# Patient Record
Sex: Male | Born: 1985
Health system: Southern US, Community
[De-identification: ages and names within clinical notes are randomized; demographics above are authoritative.]

## PROBLEM LIST (undated history)

## (undated) DIAGNOSIS — K219 Gastro-esophageal reflux disease without esophagitis: Secondary | ICD-10-CM

## (undated) HISTORY — DX: Gastro-esophageal reflux disease without esophagitis: K21.9

---

## 2017-09-22 ENCOUNTER — Encounter (HOSPITAL_COMMUNITY): Payer: Self-pay | Admitting: Emergency Medicine

## 2017-09-22 ENCOUNTER — Other Ambulatory Visit: Payer: Self-pay

## 2017-09-22 ENCOUNTER — Ambulatory Visit (HOSPITAL_COMMUNITY)
Admission: EM | Admit: 2017-09-22 | Discharge: 2017-09-22 | Disposition: A | Discharge status: Home / Self Care | Payer: BLUE CROSS/BLUE SHIELD | Attending: Internal Medicine | Admitting: Internal Medicine

## 2017-09-22 DIAGNOSIS — L0291 Cutaneous abscess, unspecified: Secondary | ICD-10-CM

## 2017-09-22 DIAGNOSIS — N611 Abscess of the breast and nipple: Secondary | ICD-10-CM | POA: Insufficient documentation

## 2017-09-22 MED ORDER — LIDOCAINE-EPINEPHRINE (PF) 2 %-1:200000 IJ SOLN
INTRAMUSCULAR | Status: AC
  Filled 2017-09-22: qty 20

## 2017-09-22 NOTE — ED Provider Notes (Signed)
[facility]    CSN: [id number] Arrival date & time: 09/22/17  1309     History   Chief Complaint Chief Complaint  Patient presents with  . Abscess    HPI Colin Butler is a 32 y.o. male.   [name] presents with complaints of abscess to right chest/ breast which has been present for approximately 2 weeks. Has not drained. Without fevers. Denies previously similar. Painful. Has not taken any medications or tried any treatments for his symptoms.    ROS per HPI.       History reviewed. No pertinent past medical history.  There are no active problems to display for this patient.   History reviewed. No pertinent surgical history.     Home Medications    Prior to Admission medications   Not on File    Family History No family history on file.  Social History Social History   Tobacco Use  . Smoking status: Not on file  Substance Use Topics  . Alcohol use: Not on file  . Drug use: Not on file     Allergies   Patient has no known allergies.   Review of Systems Review of Systems   Physical Exam Triage Vital Signs ED Triage Vitals  Enc Vitals Group     BP 09/22/17 1342 138/89     Pulse Rate 09/22/17 1342 79     Resp 09/22/17 1342 14     Temp 09/22/17 1342 97.9 F (36.6 C)     Temp src --      SpO2 09/22/17 1342 100 %     Weight --      Height --      Head Circumference --      Peak Flow --      Pain Score 09/22/17 1344 10     Pain Loc --      Pain Edu? --      Excl. in GC? --    No data found.  Updated Vital Signs BP 138/89   Pulse 79   Temp 97.9 F (36.6 C)   Resp 14   SpO2 100%   Visual Acuity Right Eye Distance:   Left Eye Distance:   Bilateral Distance:    Right Eye Near:   Left Eye Near:    Bilateral Near:     Physical Exam  Constitutional: He is oriented to person, place, and time. He appears well-developed and well-nourished.  Cardiovascular: Normal rate and regular rhythm.  Pulmonary/Chest: Effort normal  and breath sounds normal.  See photo; abscess presence; mild redness surrounding head; without fluctuance to surrounding tissue    Neurological: He is alert and oriented to person, place, and time.  Skin: Skin is warm and dry.       UC Treatments / Results  Labs (all labs ordered are listed, but only abnormal results are displayed) Labs Reviewed  AEROBIC CULTURE (SUPERFICIAL SPECIMEN)    EKG  EKG Interpretation None       Radiology No results found.  Procedures Incision and Drainage Date/Time: 09/22/2017 2:46 PM Performed by: Georgetta Haber, NP Authorized by: Arnaldo Natal, MD   Consent:    Consent obtained:  Verbal   Consent given by:  Patient and spouse   Risks discussed:  Bleeding, incomplete drainage, pain and infection   Alternatives discussed:  No treatment, alternative treatment, observation and referral Location:    Type:  Abscess   Size:  1cm    Location:  Trunk  Trunk location:  R breast Pre-procedure details:    Skin preparation:  Betadine Anesthesia (see MAR for exact dosages):    Anesthesia method:  Local infiltration   Local anesthetic:  Lidocaine 2% WITH epi Procedure type:    Complexity:  Simple Procedure details:    Needle aspiration: no     Incision types:  Single straight   Scalpel blade:  11   Wound management:  Probed and deloculated   Drainage:  Purulent (thick white)   Drainage amount:  Moderate   Wound treatment:  Wound left open   Packing materials:  None Post-procedure details:    Patient tolerance of procedure:  Tolerated well, no immediate complications   (including critical care time)  Medications Ordered in UC Medications - No data to display   Initial Impression / Assessment and Plan / UC Course  I have reviewed the triage vital signs and the nursing notes.  Pertinent labs & imaging results that were available during my care of the patient were reviewed by me and considered in my medical decision making  (see chart for details).     I&d completed, tolerated. Discharge appears cystic in nature, and with breast location encouraged establish and follow with PCP as may need further evaluation and follow up for this. Patient and wife verbalized understanding and agreeable to plan.  Culture sent. Antibiotics deferred at this time.   Final Clinical Impressions(s) / UC Diagnoses   Final diagnoses:  Abscess    ED Discharge Orders    None       Controlled Substance Prescriptions [place] Controlled Substance Registry consulted? Not Applicable   Georgetta HaberBurky, Ross Hefferan B, NP 09/22/17 1448

## 2017-09-22 NOTE — Discharge Instructions (Signed)
Tylenol and/or ibuprofen for pain control.  Apply heat to further promote drainage.  Keep wound covered. Wash daily with soap and water. Appears that this is likely cystic, may need further intervention. Please establish with a primary care provider for recheck of wound and further management/ referral as needed.

## 2017-09-22 NOTE — ED Triage Notes (Signed)
Pt c/o bump on chest 1x week. Area has white head with surrounding redness. Unknown cause, denies insect bite.

## 2017-09-24 LAB — AEROBIC CULTURE W GRAM STAIN (SUPERFICIAL SPECIMEN): Gram Stain: NONE SEEN

## 2017-09-24 LAB — AEROBIC CULTURE  (SUPERFICIAL SPECIMEN)

## 2017-09-26 ENCOUNTER — Other Ambulatory Visit: Payer: Self-pay

## 2017-09-26 ENCOUNTER — Telehealth (HOSPITAL_COMMUNITY): Payer: Self-pay | Admitting: Emergency Medicine

## 2017-09-26 ENCOUNTER — Ambulatory Visit (HOSPITAL_COMMUNITY)
Admission: EM | Admit: 2017-09-26 | Discharge: 2017-09-26 | Disposition: A | Discharge status: Home / Self Care | Payer: BLUE CROSS/BLUE SHIELD | Attending: Internal Medicine | Admitting: Internal Medicine

## 2017-09-26 ENCOUNTER — Encounter (HOSPITAL_COMMUNITY): Payer: Self-pay | Admitting: Emergency Medicine

## 2017-09-26 DIAGNOSIS — Z09 Encounter for follow-up examination after completed treatment for conditions other than malignant neoplasm: Secondary | ICD-10-CM

## 2017-09-26 DIAGNOSIS — L0291 Cutaneous abscess, unspecified: Secondary | ICD-10-CM

## 2017-09-26 MED ORDER — NAPROXEN 500 MG PO TABS: 500.0000 mg | ORAL_TABLET | Freq: Two times a day (BID) | ORAL | 0 refills | Status: DC

## 2017-09-26 MED ORDER — SULFAMETHOXAZOLE-TRIMETHOPRIM 800-160 MG PO TABS: 1.0000 | ORAL_TABLET | Freq: Two times a day (BID) | ORAL | 0 refills | Status: AC

## 2017-09-26 NOTE — ED Triage Notes (Signed)
Pt here to have wound recheck, seen here for abscess on the 14th and states it still hurting.

## 2017-09-26 NOTE — Telephone Encounter (Signed)
Called pt to notify of recent lab results Pt did not speak English so had be speak to Uncle.... Pt ID'd properly Reports persistent pain and fever Requested Bactrim to be sent to [facility] ([place]) Adv pt if sx are not getting better to return or to f/u w/PCP Notified pt that lab results can be obtained through MyChart Pt verb understanding.

## 2017-09-26 NOTE — Telephone Encounter (Signed)
-----   Message from Isa RankinLaura Wilson Murray, MD sent at 09/25/2017  7:29 AM EST ----- Clinical staff, please let patient know that abscess culture was positve for rare (few) Staph germs.  Lesion was incised and drainaged which is often curative. No additional treatment is needed if abscess is healing, with decreasing redness/swelling/drainage/pain at abscess site and no fever >1005. If increasing redness/swelling/drainage/pain at abscess site or new fever >100.5, ok to send rx for trimethoprim/sulfa DS 1 tab bid x 7d #14 no refills, and patient should recheck for further evaluation.  LM

## 2017-09-26 NOTE — Telephone Encounter (Signed)
Pt came in needing a note for work.... sts he has not been to work since 09/22/17 b/c of pain.   Per Dr. Dayton ScrapeMurray, pt is to check in for further eval.   Pt verb understanding.

## 2017-09-26 NOTE — Discharge Instructions (Addendum)
Wound is healing.  Anticipate gradual improvement in pain/swelling/redness/drainage over the next several days.  Pain may take a while to resolve.  Sometimes cysts recur and have to be incised/drained again.  Prescriptions for trimethoprim/sulfa (antibiotic) and naproxen (for pain) sent to the pharmacy. Note for work extended, return to work 1/19.

## 2017-09-27 NOTE — ED Provider Notes (Signed)
[facility]    CSN: [id number] Arrival date & time: 09/26/17  1317     History   Chief Complaint Chief Complaint  Patient presents with  . Follow-up  . Wound Check    HPI Colin Butler is a 32 y.o. male. He presents with persistent pain as high as 8/10 in abscess site.  Redness/swelling/drainage improving, no fever.  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  HPI  History reviewed. No pertinent past medical history.  History reviewed. No pertinent surgical history.     Home Medications    Prior to Admission medications   Medication Sig Start Date End Date Taking? Authorizing Provider  naproxen (NAPROSYN) 500 MG tablet Take 1 tablet (500 mg total) by mouth 2 (two) times daily. For pain 09/26/17   Isa Rankin, MD  sulfamethoxazole-trimethoprim (BACTRIM DS,SEPTRA DS) 800-160 MG tablet Take 1 tablet by mouth 2 (two) times daily for 7 days. 09/26/17 10/03/17  Isa Rankin, MD    Family History No family history on file.  Social History Social History   Tobacco Use  . Smoking status: Never Smoker  Substance Use Topics  . Alcohol use: No    Frequency: Never  . Drug use: Not on file  employed as a laborer   Allergies   Patient has no known allergies.   Review of Systems Review of Systems  All other systems reviewed and are negative.    Physical Exam Triage Vital Signs ED Triage Vitals  Enc Vitals Group     BP 09/26/17 1411 123/89     Pulse Rate 09/26/17 1411 77     Resp 09/26/17 1411 13     Temp 09/26/17 1411 98.8 F (37.1 C)     Temp src --      SpO2 09/26/17 1411 100 %     Weight --      Height --      Pain Score 09/26/17 1412 8     Pain Loc --    Updated Vital Signs BP 123/89   Pulse 77   Temp 98.8 F (37.1 C)   Resp 13   SpO2 100%  Physical Exam  Constitutional: He is oriented to person, place, and time. No distress.  Alert, nicely groomed  HENT:  Head: Atraumatic.  Eyes:  Conjugate gaze, no  eye redness/drainage  Neck: Neck supple.  Cardiovascular: Normal rate.  Pulmonary/Chest: No respiratory distress.  Abdominal: He exhibits no distension.  Musculoskeletal: Normal range of motion.  Neurological: He is alert and oriented to person, place, and time.  Skin: Skin is warm and dry.  No cyanosis Site healing, without erythema/warmth/drainage.  Scab over incision site. Still some persistence of cystic feel to wound site, mildly tender but site is not inflamed appearing, not fluctuant.  Nursing note and vitals reviewed.    UC Treatments / Results  Labs Results for orders placed or performed during the hospital encounter of 09/22/17  Wound or Superficial Culture  Result Value Ref Range   Specimen Description WOUND    Special Requests BREAST RIGHT    Gram Stain      NO WBC SEEN RARE GRAM POSITIVE COCCI RARE GRAM POSITIVE RODS    Culture RARE STAPHYLOCOCCUS SPECIES (COAGULASE NEGATIVE)    Report Status 09/24/2017 FINAL     Procedures Procedures (including critical care time) None today  Final Clinical Impressions(s) / UC Diagnoses   Final diagnoses:  Encounter for recheck of abscess following incision and drainage  Wound is healing.  Anticipate gradual improvement in pain/swelling/redness/drainage over the next several days to couple weeks.  Pain may take a while to resolve.  Sometimes cysts recur and have to be incised/drained again, or removed surgically.  Prescriptions for trimethoprim/sulfa (antibiotic) was sent to the pharmacy earlier today, based on I&D culture results, and naproxen (for pain) was sent to the pharmacy just now. Note for work extended, return to work 1/19.     ED Discharge Orders        Ordered    naproxen (NAPROSYN) 500 MG tablet  2 times daily     09/26/17 1456        Isa RankinMurray, Montell Leopard Wilson, MD 09/27/17 1137

## 2017-10-21 ENCOUNTER — Encounter: Payer: Self-pay | Admitting: Family Medicine

## 2017-10-21 ENCOUNTER — Ambulatory Visit (INDEPENDENT_AMBULATORY_CARE_PROVIDER_SITE_OTHER): Payer: BLUE CROSS/BLUE SHIELD | Admitting: Family Medicine

## 2017-10-21 ENCOUNTER — Other Ambulatory Visit: Payer: Self-pay

## 2017-10-21 VITALS — BP 124/70 | HR 69 | Temp 98.8°F | Ht 65.0 in | Wt 153.0 lb

## 2017-10-21 DIAGNOSIS — K219 Gastro-esophageal reflux disease without esophagitis: Secondary | ICD-10-CM

## 2017-10-21 DIAGNOSIS — Z13220 Encounter for screening for lipoid disorders: Secondary | ICD-10-CM | POA: Diagnosis not present

## 2017-10-21 DIAGNOSIS — Z130 Encounter for screening for diseases of the blood and blood-forming organs and certain disorders involving the immune mechanism: Secondary | ICD-10-CM

## 2017-10-21 DIAGNOSIS — Z23 Encounter for immunization: Secondary | ICD-10-CM | POA: Diagnosis not present

## 2017-10-21 DIAGNOSIS — Z114 Encounter for screening for human immunodeficiency virus [HIV]: Secondary | ICD-10-CM

## 2017-10-21 DIAGNOSIS — M25511 Pain in right shoulder: Secondary | ICD-10-CM

## 2017-10-21 DIAGNOSIS — Z136 Encounter for screening for cardiovascular disorders: Secondary | ICD-10-CM

## 2017-10-21 MED ORDER — IBUPROFEN 600 MG PO TABS: 600.0000 mg | ORAL_TABLET | Freq: Four times a day (QID) | ORAL | 0 refills | Status: DC | PRN

## 2017-10-21 MED ORDER — RANITIDINE HCL 150 MG PO TABS: 150.0000 mg | ORAL_TABLET | Freq: Two times a day (BID) | ORAL | 1 refills | Status: DC

## 2017-10-21 NOTE — Patient Instructions (Signed)
It was great seeing you today! We have addressed the following issues today  1. I will do blood work for you today (lipid panel , CBC, CMP ) and will follow up on the results. 2. I prescribe a medication for your reflux 3. I want you to use ibuprofen, and ice for your shoulder and neck pain. If it does not get better will do further testing.  If we did any lab work today, and the results require attention, either me or my nurse will get in touch with you. If everything is normal, you will get a letter in mail and a message via . If you don't hear from us in two weeks, please give us a call. Otherwise, we look forward to seeing you again at your next visit. If you have any questions or concerns before then, please call the clinic at [phone].  Please bring all your medications to every doctors visit  Sign up for My Chart to have easy access to your labs results, and communication with your Primary care physician. Please ask Front Desk for some assistance.   Please check-out at the front desk before leaving the clinic.    Take Care,   Dr. Sydnee Cabaliallo  Shoulder Exercises Ask your health care provider which exercises are safe for you. Do exercises exactly as told by your health care provider and adjust them as directed. It is normal to feel mild stretching, pulling, tightness, or discomfort as you do these exercises, but you should stop right away if you feel sudden pain or your pain gets worse.Do not begin these exercises until told by your health care provider. RANGE OF MOTION EXERCISES These exercises warm up your muscles and joints and improve the movement and flexibility of your shoulder. These exercises also help to relieve pain, numbness, and tingling. These exercises involve stretching your injured shoulder directly. Exercise A: Pendulum  1. Stand near a wall or a surface that you can hold onto for balance. 2. Bend at the waist and let your left / right arm hang straight down. Use  your other arm to support you. Keep your back straight and do not lock your knees. 3. Relax your left / right arm and shoulder muscles, and move your hips and your trunk so your left / right arm swings freely. Your arm should swing because of the motion of your body, not because you are using your arm or shoulder muscles. 4. Keep moving your body so your arm swings in the following directions, as told by your health care provider: ? Side to side. ? Forward and backward. ? In clockwise and counterclockwise circles. 5. Continue each motion for __________ seconds, or for as long as told by your health care provider. 6. Slowly return to the starting position. Repeat __________ times. Complete this exercise __________ times a day. Exercise B:Flexion, Standing  1. Stand and hold a broomstick, a cane, or a similar object. Place your hands a little more than shoulder-width apart on the object. Your left / right hand should be palm-up, and your other hand should be palm-down. 2. Keep your elbow straight and keep your shoulder muscles relaxed. Push the stick down with your healthy arm to raise your left / right arm in front of your body, and then over your head until you feel a stretch in your shoulder. ? Avoid shrugging your shoulder while you raise your arm. Keep your shoulder blade tucked down toward the middle of your back. 3. Hold for  __________ seconds. 4. Slowly return to the starting position. Repeat __________ times. Complete this exercise __________ times a day. Exercise C: Abduction, Standing 1. Stand and hold a broomstick, a cane, or a similar object. Place your hands a little more than shoulder-width apart on the object. Your left / right hand should be palm-up, and your other hand should be palm-down. 2. While keeping your elbow straight and your shoulder muscles relaxed, push the stick across your body toward your left / right side. Raise your left / right arm to the side of your body and  then over your head until you feel a stretch in your shoulder. ? Do not raise your arm above shoulder height, unless your health care provider tells you to do that. ? Avoid shrugging your shoulder while you raise your arm. Keep your shoulder blade tucked down toward the middle of your back. 3. Hold for __________ seconds. 4. Slowly return to the starting position. Repeat __________ times. Complete this exercise __________ times a day. Exercise D:Internal Rotation  1. Place your left / right hand behind your back, palm-up. 2. Use your other hand to dangle an exercise band, a towel, or a similar object over your shoulder. Grasp the band with your left / right hand so you are holding onto both ends. 3. Gently pull up on the band until you feel a stretch in the front of your left / right shoulder. ? Avoid shrugging your shoulder while you raise your arm. Keep your shoulder blade tucked down toward the middle of your back. 4. Hold for __________ seconds. 5. Release the stretch by letting go of the band and lowering your hands. Repeat __________ times. Complete this exercise __________ times a day. STRETCHING EXERCISES These exercises warm up your muscles and joints and improve the movement and flexibility of your shoulder. These exercises also help to relieve pain, numbness, and tingling. These exercises are done using your healthy shoulder to help stretch the muscles of your injured shoulder. Exercise E: Research officer, political party (External Rotation and Abduction)  1. Stand in a doorway with one of your feet slightly in front of the other. This is called a staggered stance. If you cannot reach your forearms to the door frame, stand facing a corner of a room. 2. Choose one of the following positions as told by your health care provider: ? Place your hands and forearms on the door frame above your head. ? Place your hands and forearms on the door frame at the height of your head. ? Place your hands on the door  frame at the height of your elbows. 3. Slowly move your weight onto your front foot until you feel a stretch across your chest and in the front of your shoulders. Keep your head and chest upright and keep your abdominal muscles tight. 4. Hold for __________ seconds. 5. To release the stretch, shift your weight to your back foot. Repeat __________ times. Complete this stretch __________ times a day. Exercise F:Extension, Standing 1. Stand and hold a broomstick, a cane, or a similar object behind your back. ? Your hands should be a little wider than shoulder-width apart. ? Your palms should face away from your back. 2. Keeping your elbows straight and keeping your shoulder muscles relaxed, move the stick away from your body until you feel a stretch in your shoulder. ? Avoid shrugging your shoulders while you move the stick. Keep your shoulder blade tucked down toward the middle of your back. 3. Hold for  __________ seconds. 4. Slowly return to the starting position. Repeat __________ times. Complete this exercise __________ times a day. STRENGTHENING EXERCISES These exercises build strength and endurance in your shoulder. Endurance is the ability to use your muscles for a long time, even after they get tired. Exercise G:External Rotation  1. Sit in a stable chair without armrests. 2. Secure an exercise band at elbow height on your left / right side. 3. Place a soft object, such as a folded towel or a small pillow, between your left / right upper arm and your body to move your elbow a few inches away (about 10 cm) from your side. 4. Hold the end of the band so it is tight and there is no slack. 5. Keeping your elbow pressed against the soft object, move your left / right forearm out, away from your abdomen. Keep your body steady so only your forearm moves. 6. Hold for __________ seconds. 7. Slowly return to the starting position. Repeat __________ times. Complete this exercise __________ times  a day. Exercise H:Shoulder Abduction  1. Sit in a stable chair without armrests, or stand. 2. Hold a __________ weight in your left / right hand, or hold an exercise band with both hands. 3. Start with your arms straight down and your left / right palm facing in, toward your body. 4. Slowly lift your left / right hand out to your side. Do not lift your hand above shoulder height unless your health care provider tells you that this is safe. ? Keep your arms straight. ? Avoid shrugging your shoulder while you do this movement. Keep your shoulder blade tucked down toward the middle of your back. 5. Hold for __________ seconds. 6. Slowly lower your arm, and return to the starting position. Repeat __________ times. Complete this exercise __________ times a day. Exercise I:Shoulder Extension 1. Sit in a stable chair without armrests, or stand. 2. Secure an exercise band to a stable object in front of you where it is at shoulder height. 3. Hold one end of the exercise band in each hand. Your palms should face each other. 4. Straighten your elbows and lift your hands up to shoulder height. 5. Step back, away from the secured end of the exercise band, until the band is tight and there is no slack. 6. Squeeze your shoulder blades together as you pull your hands down to the sides of your thighs. Stop when your hands are straight down by your sides. Do not let your hands go behind your body. 7. Hold for __________ seconds. 8. Slowly return to the starting position. Repeat __________ times. Complete this exercise __________ times a day. Exercise J:Standing Shoulder Row 1. Sit in a stable chair without armrests, or stand. 2. Secure an exercise band to a stable object in front of you so it is at waist height. 3. Hold one end of the exercise band in each hand. Your palms should be in a thumbs-up position. 4. Bend each of your elbows to an "L" shape (about 90 degrees) and keep your upper arms at your  sides. 5. Step back until the band is tight and there is no slack. 6. Slowly pull your elbows back behind you. 7. Hold for __________ seconds. 8. Slowly return to the starting position. Repeat __________ times. Complete this exercise __________ times a day. Exercise K:Shoulder Press-Ups  1. Sit in a stable chair that has armrests. Sit upright, with your feet flat on the floor. 2. Put your hands on  the armrests so your elbows are bent and your fingers are pointing forward. Your hands should be about even with the sides of your body. 3. Push down on the armrests and use your arms to lift yourself off of the chair. Straighten your elbows and lift yourself up as much as you comfortably can. ? Move your shoulder blades down, and avoid letting your shoulders move up toward your ears. ? Keep your feet on the ground. As you get stronger, your feet should support less of your body weight as you lift yourself up. 4. Hold for __________ seconds. 5. Slowly lower yourself back into the chair. Repeat __________ times. Complete this exercise __________ times a day. Exercise L: Wall Push-Ups  1. Stand so you are facing a stable wall. Your feet should be about one arm-length away from the wall. 2. Lean forward and place your palms on the wall at shoulder height. 3. Keep your feet flat on the floor as you bend your elbows and lean forward toward the wall. 4. Hold for __________ seconds. 5. Straighten your elbows to push yourself back to the starting position. Repeat __________ times. Complete this exercise __________ times a day. This information is not intended to replace advice given to you by your health care provider. Make sure you discuss any questions you have with your health care provider. Document Released: 07/10/2005 Document Revised: 05/20/2016 Document Reviewed: 05/07/2015 Elsevier Interactive Patient Education  2018 [employer].

## 2017-10-22 LAB — CMP14+EGFR
A/G RATIO: 1.7 (ref 1.2–2.2)
ALK PHOS: 84 IU/L (ref 39–117)
ALT: 38 IU/L (ref 0–44)
AST: 26 IU/L (ref 0–40)
Albumin: 5.2 g/dL (ref 3.5–5.5)
BUN/Creatinine Ratio: 7 — ABNORMAL LOW (ref 9–20)
BUN: 6 mg/dL (ref 6–20)
Bilirubin Total: 0.5 mg/dL (ref 0.0–1.2)
CALCIUM: 9.8 mg/dL (ref 8.7–10.2)
CO2: 24 mmol/L (ref 20–29)
Chloride: 104 mmol/L (ref 96–106)
Creatinine, Ser: 0.88 mg/dL (ref 0.76–1.27)
GFR calc Af Amer: 134 mL/min/{1.73_m2} (ref >59)
GFR, EST NON AFRICAN AMERICAN: 116 mL/min/{1.73_m2} (ref >59)
Globulin, Total: 3 g/dL (ref 1.5–4.5)
Glucose: 82 mg/dL (ref 65–99)
POTASSIUM: 4.2 mmol/L (ref 3.5–5.2)
Sodium: 144 mmol/L (ref 134–144)
Total Protein: 8.2 g/dL (ref 6.0–8.5)

## 2017-10-22 LAB — CBC WITH DIFFERENTIAL/PLATELET
Basophils Absolute: 0 10*3/uL (ref 0.0–0.2)
Basos: 0 %
EOS (ABSOLUTE): 0.1 10*3/uL (ref 0.0–0.4)
EOS: 1 %
HEMATOCRIT: 42.1 % (ref 37.5–51.0)
HEMOGLOBIN: 14.8 g/dL (ref 13.0–17.7)
Immature Grans (Abs): 0 10*3/uL (ref 0.0–0.1)
Immature Granulocytes: 0 %
Lymphocytes Absolute: 3 10*3/uL (ref 0.7–3.1)
Lymphs: 31 %
MCH: 28.9 pg (ref 26.6–33.0)
MCHC: 35.2 g/dL (ref 31.5–35.7)
MCV: 82 fL (ref 79–97)
MONOCYTES: 6 %
Monocytes Absolute: 0.6 10*3/uL (ref 0.1–0.9)
NEUTROS ABS: 6.2 10*3/uL (ref 1.4–7.0)
Neutrophils: 62 %
Platelets: 279 10*3/uL (ref 150–379)
RBC: 5.12 x10E6/uL (ref 4.14–5.80)
RDW: 12.7 % (ref 12.3–15.4)
WBC: 9.9 10*3/uL (ref 3.4–10.8)

## 2017-10-22 LAB — LIPID PANEL
CHOL/HDL RATIO: 4.1 ratio (ref 0.0–5.0)
Cholesterol, Total: 151 mg/dL (ref 100–199)
HDL: 37 mg/dL — ABNORMAL LOW (ref >39)
LDL CALC: 80 mg/dL (ref 0–99)
TRIGLYCERIDES: 168 mg/dL — AB (ref 0–149)
VLDL CHOLESTEROL CAL: 34 mg/dL (ref 5–40)

## 2017-10-22 LAB — HIV ANTIBODY (ROUTINE TESTING W REFLEX): HIV SCREEN 4TH GENERATION: NONREACTIVE

## 2017-10-22 NOTE — Progress Notes (Signed)
Subjective:   Chief Complaint  Patient presents with  . Establish Care   HPI Read is a 32 y.o. old male here  for annual exam.  Concern today: Reflux and upper back pain Changes in his/her health in the last 12 months: no Occupation: Oncologist, stitching/Tailor Wears seatbelt: yes.    The patient has regular exercise: no.   Enough vegetables and fruits: yes.  Smokes cigarette: yes Drinks EtOH: Occasionally Drug use: no Patient takes ASA: no.  Patient takes vitD & Ca: no. Ever been transfused or tattooed?: no.  The patient is sexually active.  Patient uses birth control: no.  Domestic violence: no.  Advance directive: no. MOST: no.   History of depression:no.  Patient dental home: no.   Immunizations  Needs influenza vaccine: yes.  Needs HPV (Women until age 44): n/a  Needs Shingrix (all >35yr of age): no.  Needs Tdap: no.  Needs Pneumococcal: no. 1. 166to 30years of age  -Intermediate risk groups (smokers; chronic heart, lung and liver  disease, DM & alcoholism) PPSV23 alone:  (Grade 1B).   -High risk groups (asplenia, immunocompromised [HIV, CA], CSF leak, cochlear implant, advanced kidney dis)-PCV13, then PPSV23 after 8 wks. (Grade 1B). PCV13 after 89yrf already had PPSV23.  2.   Age ? 65: PCV13 followed by PPSV23 6 to 12 months later. PCV13 after 1y61yr already had PPSV23.  Screening Need colon cancer screening: no. Need breast cancer screening: Not applicable. Need cervical cancer Screening: not applicable. STOP BANG >/=3 for OSA: no. Need lung cancer screening (men > 55):no. Need AAA screening (men 65-74, >100 cigarettes):no At risk for skin cancer: no. Need HCV Screening: no. Need STI Screening: no. Fall in the last 12 months:no  PMH/Problem List: does not have a problem list on file.   has no past medical history on file.  [facility]. No pertinent family history. Family history of heart disease before age of 60 57s:  no. Family history of stroke: no. Family history of cancer: no.  SH Social History   Tobacco Use  . Smoking status: Never Smoker  . Smokeless tobacco: Never Used  Substance Use Topics  . Alcohol use: No    Frequency: Never  . Drug use: Not on file     Review of Systems  Constitutional: Negative.   HENT: Negative.   Eyes: Negative.         Objective:   Physical Exam Vitals:   10/21/17 1538  BP: 124/70  Pulse: 69  Temp: 98.8 F (37.1 C)  TempSrc: Oral  SpO2: 99%  Weight: 153 lb (69.4 kg)  Height: 5' 5"  (1.651 m)   Body mass index is 25.46 kg/m.  GEN: appears well, no apparent distress. Head: normocephalic and atraumatic  Eyes: conjunctiva without injection, sclera anicteric Ears: external ear and ear canal normal Nares: no rhinorrhea, congestion or erythema Oropharynx: mmm without erythema or exudation, poor dentition with multiples carries.  HEM: negative for cervical or periauricular lymphadenopathies CVS: RRR, nl s1 & s2, no murmurs, no edema,  2+ DP & PT bil RESP: no IWOB, good air movement bilaterally, CTAB GI: BS present & normal, soft, NTND, no guarding, no rebound, no mass GU: no suprapubic or CVA tenderness MSK: upper back tenderness radiating to neck on the right side. Right shoulder exam is normal. Left upper back and shoulder exam is within normal limit. SKIN: no apparent skin lesion ENDO: negative thyromegaly  NEURO: alert and oiented appropriately, no  gross deficits  PSYCH: euthymic mood with congruent affect     Assessment & Plan:  1. Need for immunization against influenza  - Received Flu Vaccine QUAD 36+ mos IM  2. Gastroesophageal reflux disease, esophagitis presence not specified Patient complaints of history of reflux. PreViously on medications for it that he could not remember - ranitidine (ZANTAC) 150 MG tablet; Take 1 tablet (150 mg total) by mouth 2 (two) times daily.  Dispense: 60 tablet; Refill: 1  3. Screening for deficiency  anemia  - CBC with Differential  4. Screening for hyperlipidemia  - Lipid panel  5. Screening for HIV (human immunodeficiency virus)  - HIV antibody  6. Screening for hypertension  - CMP14+EGFR  7. Pain in joint of right shoulder and upper back  Normal exam with good range of motion and no sign of dislocation of history of trauma. Pain/ tenderness appears to be mostly muscular in nature likely occupational. Patient works in a [place], use machinery in a repetitive manner on a daily basis. Recommend rest, ice with antiinflammatory. Gave handout for shoulder exercise. - ibuprofen (ADVIL,MOTRIN) 600 MG tablet; Take 1 tablet (600 mg total) by mouth every 6 (six) hours as needed for mild pain or moderate pain.  Dispense: 30 tablet; Refill: 0  8. Smoking cessation Patient is currently smoking 4-5 cigarettes a week. He has been weaning himself off for the past few months and intent to quit soon. No quit date set.  9. Poor dental care Multiple carries noted on exam , recommended finding dentist .   Marjie Skiff, MD [place], PGY-2  10/22/17  1:08 PM

## 2017-10-22 NOTE — Progress Notes (Deleted)
   Subjective:    Patient ID: Colin Butler, male    DOB: 1986/06/18, 32 y.o.   MRN: [id number]   CC: Establish care with new PCP  HPI: Patient is a [age] year old male with past medical history significant for GERD who presents today to establish care with new PCP.  Patient speaks Nepali with minimal spoken GhanaEnglish English.  Translator was used throughout the visit.  Patient reports that he was previously on reflux medication when he was living in [place] in 2015 but cannot recall the name of the medication.  Patient works in [employer] and complaints of upper back shoulder pain.  Is otherwise doing well  Smoking status reviewed   ROS: all other systems were reviewed and are negative other than in the HPI   History reviewed. No pertinent past medical history.  History reviewed. No pertinent surgical history.  Past medical history, surgical, family, and social history reviewed and updated in the EMR as appropriate.  Objective:  BP 124/70   Pulse 69   Temp 98.8 F (37.1 C) (Oral)   Ht 5\' 5"  (1.651 m)   Wt 153 lb (69.4 kg)   SpO2 99%   BMI 25.46 kg/m   Vitals and nursing note reviewed  General: NAD, pleasant, able to participate in exam Cardiac: RRR, normal heart sounds, no murmurs. 2+ radial and PT pulses bilaterally Respiratory: CTAB, normal effort, No wheezes, rales or rhonchi Abdomen: soft, nontender, nondistended, no hepatic or splenomegaly, +BS Extremities: no edema or cyanosis. WWP. Skin: warm and dry, no rashes noted Neuro: alert and oriented x4, no focal deficits Psych: Normal affect and mood   Assessment & Plan:    No problem-specific Assessment & Plan notes found for this encounter.    Lovena NeighboursAbdoulaye Kinan Safley, MD [facility] PGY-2

## 2017-12-19 ENCOUNTER — Ambulatory Visit: Payer: BLUE CROSS/BLUE SHIELD | Admitting: Family Medicine

## 2017-12-31 ENCOUNTER — Ambulatory Visit (HOSPITAL_COMMUNITY)
Admission: EM | Admit: 2017-12-31 | Discharge: 2017-12-31 | Disposition: A | Discharge status: Home / Self Care | Payer: BLUE CROSS/BLUE SHIELD | Attending: Emergency Medicine | Admitting: Emergency Medicine

## 2017-12-31 ENCOUNTER — Encounter (HOSPITAL_COMMUNITY): Payer: Self-pay | Admitting: Emergency Medicine

## 2017-12-31 DIAGNOSIS — B9789 Other viral agents as the cause of diseases classified elsewhere: Secondary | ICD-10-CM

## 2017-12-31 DIAGNOSIS — M25511 Pain in right shoulder: Secondary | ICD-10-CM

## 2017-12-31 DIAGNOSIS — J069 Acute upper respiratory infection, unspecified: Secondary | ICD-10-CM | POA: Diagnosis not present

## 2017-12-31 MED ORDER — IBUPROFEN 600 MG PO TABS: 600.0000 mg | ORAL_TABLET | Freq: Four times a day (QID) | ORAL | 0 refills | Status: DC | PRN

## 2017-12-31 MED ORDER — BENZONATATE 200 MG PO CAPS: 200.0000 mg | ORAL_CAPSULE | Freq: Three times a day (TID) | ORAL | 0 refills | Status: DC | PRN

## 2017-12-31 MED ORDER — FLUTICASONE PROPIONATE 50 MCG/ACT NA SUSP: 2.0000 | Freq: Every day | NASAL | 0 refills | Status: DC

## 2017-12-31 NOTE — Discharge Instructions (Addendum)
Take the medication as written. Take 1 gram of tylenol with the 600 mg motrin up to 3-4 times a day as needed for pain and fever. This is an effective combination. Drink extra fluids. Start taking mucinex D to keep the mucus secretions thin. Use the NeilMed sinus rinse with distilled water as often as you want to to reduce nasal congestion. Follow the directions on the box.  Tessalon will help with cough.  Go to www.goodrx.com to look up your medications. This will give you a list of where you can find your prescriptions at the most affordable prices. Or ask the pharmacist what the cash price is, or if they have any other discount programs available to help make your medication more affordable. This can be less expensive than what you would pay with insurance.

## 2017-12-31 NOTE — ED Triage Notes (Signed)
Pt sts cough and congestion x 2 days  

## 2017-12-31 NOTE — ED Provider Notes (Signed)
HPI  SUBJECTIVE:  Colin Butler is a 32 y.o. male who presents with cough, white nasal congestion for the past 2 days.  He reports a frontal sinus headache, rhinorrhea, postnasal drip.  He reports chest soreness secondary to the coughing.  States that he is sleeping okay at night.  No allergy type symptoms, fevers, body aches.  No wheezing, shortness of breath, dyspnea on exertion.  No GERD symptoms.  No sick contacts with URI symptoms.  No aggravating or alleviating factors.  He has not tried anything for this.  Past medical history negative for asthma, emphysema, COPD, smoking, diabetes, hypertension, allergies.  ONG:EXBMWU, Lilia Argue, MD  History reviewed. No pertinent past medical history.  History reviewed. No pertinent surgical history.  History reviewed. No pertinent family history.  Social History   Tobacco Use  . Smoking status: Never Smoker  . Smokeless tobacco: Never Used  Substance Use Topics  . Alcohol use: No    Frequency: Never  . Drug use: Not on file    No current facility-administered medications for this encounter.   Current Outpatient Medications:  .  benzonatate (TESSALON) 200 MG capsule, Take 1 capsule (200 mg total) by mouth 3 (three) times daily as needed for cough., Disp: 30 capsule, Rfl: 0 .  fluticasone (FLONASE) 50 MCG/ACT nasal spray, Place 2 sprays into both nostrils daily., Disp: 16 g, Rfl: 0 .  ibuprofen (ADVIL,MOTRIN) 600 MG tablet, Take 1 tablet (600 mg total) by mouth every 6 (six) hours as needed for mild pain or moderate pain., Disp: 30 tablet, Rfl: 0 .  ranitidine (ZANTAC) 150 MG tablet, Take 1 tablet (150 mg total) by mouth 2 (two) times daily., Disp: 60 tablet, Rfl: 1  No Known Allergies   ROS  As noted in HPI.   Physical Exam  BP (!) 139/92 (BP Location: Left Arm)   Pulse 87   Temp 98.5 F (36.9 C) (Oral)   Resp 18   SpO2 100%   Constitutional: Well developed, well nourished, no acute distress Eyes:  EOMI, conjunctiva normal  bilaterally HENT: Normocephalic, atraumatic,mucus membranes moist.  Positive mucoid nasal congestion.  Normal turbinates.  Positive cobblestoning, no postnasal drip.  Mild maxillary sinus tenderness.  No frontal sinus tenderness. Respiratory: Normal inspiratory effort good air movement.  No chest wall tenderness Cardiovascular: Normal rate, no murmurs, rubs, gallops GI: nondistended skin: No rash, skin intact Musculoskeletal: no deformities Neurologic: Alert & oriented x 3, no focal neuro deficits Psychiatric: Speech and behavior appropriate   ED Course   Medications - No data to display  No orders of the defined types were placed in this encounter.   No results found for this or any previous visit (from the past 24 hour(s)). No results found.  ED Clinical Impression  Viral URI with cough  Pain in joint of right shoulder - Plan: ibuprofen (ADVIL,MOTRIN) 600 MG tablet   ED Assessment/Plan  Presentation consistent with a URI.  Home with supportive treatment including Flonase, saline nasal irrigation with a Lloyd Huger med sinus rinse, Tessalon, ibuprofen 600 mg to take with 1 g of Tylenol 3 or 4 times a day as needed for chest wall pain.  No indications for antibiotics at this time, discussed with patient that this is most likely viral.  Follow-up with his PMD or here as needed.   Meds ordered this encounter  Medications  . ibuprofen (ADVIL,MOTRIN) 600 MG tablet    Sig: Take 1 tablet (600 mg total) by mouth every 6 (six) hours as needed  for mild pain or moderate pain.    Dispense:  30 tablet    Refill:  0  . benzonatate (TESSALON) 200 MG capsule    Sig: Take 1 capsule (200 mg total) by mouth 3 (three) times daily as needed for cough.    Dispense:  30 capsule    Refill:  0  . fluticasone (FLONASE) 50 MCG/ACT nasal spray    Sig: Place 2 sprays into both nostrils daily.    Dispense:  16 g    Refill:  0    *This clinic note was created using Scientist, clinical (histocompatibility and immunogenetics)Dragon dictation software. Therefore,  there may be occasional mistakes despite careful proofreading.   ?   Domenick GongMortenson, Thornton Dohrmann, MD 12/31/17 1308

## 2018-01-06 ENCOUNTER — Ambulatory Visit (HOSPITAL_COMMUNITY)
Admission: EM | Admit: 2018-01-06 | Discharge: 2018-01-06 | Disposition: A | Discharge status: Home / Self Care | Payer: BLUE CROSS/BLUE SHIELD | Attending: Family Medicine | Admitting: Family Medicine

## 2018-01-06 ENCOUNTER — Encounter (HOSPITAL_COMMUNITY): Payer: Self-pay | Admitting: Emergency Medicine

## 2018-01-06 DIAGNOSIS — J302 Other seasonal allergic rhinitis: Secondary | ICD-10-CM | POA: Diagnosis not present

## 2018-01-06 MED ORDER — PREDNISONE 20 MG PO TABS: ORAL_TABLET | ORAL | 0 refills | Status: DC

## 2018-01-06 NOTE — ED Triage Notes (Signed)
Pt sts URI sx with cough and body aches  

## 2018-01-06 NOTE — ED Provider Notes (Signed)
[facility]   [id number] 01/06/18 Arrival Time: 0958   SUBJECTIVE:  Colin Butler is a 32 y.o. male who presents to the urgent care with complaint of URI sx with cough and body aches  History reviewed. No pertinent past medical history. History reviewed. No pertinent family history. Social History   Socioeconomic History  . Marital status: Married    Spouse name: Not on file  . Number of children: Not on file  . Years of education: Not on file  . Highest education level: Not on file  Occupational History  . Not on file  Social Needs  . Financial resource strain: Not on file  . Food insecurity:    Worry: Not on file    Inability: Not on file  . Transportation needs:    Medical: Not on file    Non-medical: Not on file  Tobacco Use  . Smoking status: Never Smoker  . Smokeless tobacco: Never Used  Substance and Sexual Activity  . Alcohol use: No    Frequency: Never  . Drug use: Not on file  . Sexual activity: Not on file  Lifestyle  . Physical activity:    Days per week: Not on file    Minutes per session: Not on file  . Stress: Not on file  Relationships  . Social connections:    Talks on phone: Not on file    Gets together: Not on file    Attends religious service: Not on file    Active member of club or organization: Not on file    Attends meetings of clubs or organizations: Not on file    Relationship status: Not on file  . Intimate partner violence:    Fear of current or ex partner: Not on file    Emotionally abused: Not on file    Physically abused: Not on file    Forced sexual activity: Not on file  Other Topics Concern  . Not on file  Social History Narrative  . Not on file   No outpatient medications have been marked as taking for the 01/06/18 encounter [facility] Encounter).   No Known Allergies    ROS: As per HPI, remainder of ROS negative.   OBJECTIVE:   Vitals:   01/06/18 1015  BP: 129/90  Pulse: 71  Resp: 16  Temp: 98.9  F (37.2 C)  TempSrc: Oral  SpO2: 100%     General appearance: alert; no distress Eyes: PERRL; EOMI; conjunctiva normal HENT: normocephalic; atraumatic; TMs serous changes with bubbles, canal normal, external ears normal without trauma; nasal mucosa normal; oral mucosa normal Neck: supple Lungs: clear to auscultation bilaterally Heart: regular rate and rhythm Back: no CVA tenderness Extremities: no cyanosis or edema; symmetrical with no gross deformities Skin: warm and dry Neurologic: normal gait; grossly normal Psychological: alert and cooperative; normal mood and affect      Labs:  Results for orders placed or performed in visit on 10/21/17  CBC with Differential  Result Value Ref Range   WBC 9.9 3.4 - 10.8 x10E3/uL   RBC 5.12 4.14 - 5.80 x10E6/uL   Hemoglobin 14.8 13.0 - 17.7 g/dL   Hematocrit 42.1 37.5 - 51.0 %   MCV 82 79 - 97 fL   MCH 28.9 26.6 - 33.0 pg   MCHC 35.2 31.5 - 35.7 g/dL   RDW 12.7 12.3 - 15.4 %   Platelets 279 150 - 379 x10E3/uL   Neutrophils 62 Not Estab. %   Lymphs 31 Not Estab. %  Monocytes 6 Not Estab. %   Eos 1 Not Estab. %   Basos 0 Not Estab. %   Neutrophils Absolute 6.2 1.4 - 7.0 x10E3/uL   Lymphocytes Absolute 3.0 0.7 - 3.1 x10E3/uL   Monocytes Absolute 0.6 0.1 - 0.9 x10E3/uL   EOS (ABSOLUTE) 0.1 0.0 - 0.4 x10E3/uL   Basophils Absolute 0.0 0.0 - 0.2 x10E3/uL   Immature Granulocytes 0 Not Estab. %   Immature Grans (Abs) 0.0 0.0 - 0.1 x10E3/uL  Lipid panel  Result Value Ref Range   Cholesterol, Total 151 100 - 199 mg/dL   Triglycerides 168 (H) 0 - 149 mg/dL   HDL 37 (L) >39 mg/dL   VLDL Cholesterol Cal 34 5 - 40 mg/dL   LDL Calculated 80 0 - 99 mg/dL   Chol/HDL Ratio 4.1 0.0 - 5.0 ratio  CMP14+EGFR  Result Value Ref Range   Glucose 82 65 - 99 mg/dL   BUN 6 6 - 20 mg/dL   Creatinine, Ser 0.88 0.76 - 1.27 mg/dL   GFR calc non Af Amer 116 >59 mL/min/1.73   GFR calc Af Amer 134 >59 mL/min/1.73   BUN/Creatinine Ratio 7 (L) 9 - 20    Sodium 144 134 - 144 mmol/L   Potassium 4.2 3.5 - 5.2 mmol/L   Chloride 104 96 - 106 mmol/L   CO2 24 20 - 29 mmol/L   Calcium 9.8 8.7 - 10.2 mg/dL   Total Protein 8.2 6.0 - 8.5 g/dL   Albumin 5.2 3.5 - 5.5 g/dL   Globulin, Total 3.0 1.5 - 4.5 g/dL   Albumin/Globulin Ratio 1.7 1.2 - 2.2   Bilirubin Total 0.5 0.0 - 1.2 mg/dL   Alkaline Phosphatase 84 39 - 117 IU/L   AST 26 0 - 40 IU/L   ALT 38 0 - 44 IU/L  HIV antibody  Result Value Ref Range   HIV Screen 4th Generation wRfx Non Reactive Non Reactive    Labs Reviewed - No data to display  No results found.     ASSESSMENT & PLAN:  1. Seasonal allergies     Meds ordered this encounter  Medications  . predniSONE (DELTASONE) 20 MG tablet    Sig: Two daily with food    Dispense:  10 tablet    Refill:  0    Reviewed expectations re: course of current medical issues. Questions answered. Outlined signs and symptoms indicating need for more acute intervention. Patient verbalized understanding. After Visit Summary given.       Robyn Haber, MD 01/06/18 1047

## 2018-02-27 ENCOUNTER — Other Ambulatory Visit: Payer: Self-pay

## 2018-02-27 ENCOUNTER — Ambulatory Visit: Payer: BLUE CROSS/BLUE SHIELD | Admitting: Internal Medicine

## 2018-02-27 ENCOUNTER — Encounter: Payer: Self-pay | Admitting: Internal Medicine

## 2018-02-27 VITALS — BP 110/82 | HR 73 | Temp 98.4°F | Wt 153.0 lb

## 2018-02-27 DIAGNOSIS — H5713 Ocular pain, bilateral: Secondary | ICD-10-CM | POA: Diagnosis not present

## 2018-02-27 DIAGNOSIS — M549 Dorsalgia, unspecified: Secondary | ICD-10-CM

## 2018-02-27 DIAGNOSIS — J029 Acute pharyngitis, unspecified: Secondary | ICD-10-CM

## 2018-02-27 DIAGNOSIS — G8929 Other chronic pain: Secondary | ICD-10-CM | POA: Diagnosis not present

## 2018-02-27 MED ORDER — CYCLOBENZAPRINE HCL 10 MG PO TABS
10.0000 mg | ORAL_TABLET | Freq: Every evening | ORAL | 0 refills | Status: DC | PRN
Start: 2018-02-27 — End: 2018-04-01

## 2018-02-27 MED ORDER — OLOPATADINE HCL 0.2 % OP SOLN: 1.0000 [drp] | Freq: Two times a day (BID) | OPHTHALMIC | 1 refills | Status: DC | PRN

## 2018-02-27 NOTE — Patient Instructions (Addendum)
Mr. Colin Butler,  Please try the muscle relaxant flexeril before bed. Continue tylenol and ibuprofen as needed for pain. I will refer you to physical therapy to work on exercises to reduce pain.  Try pataday allergy drops for dry itchy eye.  You could try an over-the-counter nasal spray or antihistamine like zyrtec for seasonal allergies that could cause some hoarse throat. Continue zantac for reflux, as this could also cause hoarse throat.  Best, Dr. Sampson GoonFitzgerald

## 2018-03-02 DIAGNOSIS — G8929 Other chronic pain: Secondary | ICD-10-CM | POA: Insufficient documentation

## 2018-03-02 DIAGNOSIS — M549 Dorsalgia, unspecified: Principal | ICD-10-CM

## 2018-03-02 DIAGNOSIS — J029 Acute pharyngitis, unspecified: Secondary | ICD-10-CM | POA: Insufficient documentation

## 2018-03-02 DIAGNOSIS — H5713 Ocular pain, bilateral: Secondary | ICD-10-CM | POA: Insufficient documentation

## 2018-03-02 NOTE — Assessment & Plan Note (Signed)
-   Appears musculoskeletal in nature and patient with physical job. No weakness to suggest nerve injury. - Recommended trial of flexeril before bed. To continue ibuprofen prn. Will refer to physical therapy given chronicity of issue. Could also consider therapies like massage and topical NSAIDs or capsaicin.

## 2018-03-02 NOTE — Progress Notes (Signed)
[facility] Progress Note  Subjective:  Colin Butler is a 32 y.o. male with history of back pain who presents for back pain, eye pain, and sore throat.  Visit assisted by Nepali video interpreter.   #Back pain: - Describes as shoulder pain but points to upper back. Describes it as a sharp pain all the time that hurts. 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  - Ibuprofen has not helped. Said he saw a specialist in [place] previously but was not given an answer about what it was. - Has been dealing with this issue for last 4-5 years - Has never had PT and does not think he has tried muscle relaxant - Was given PO prednisone for sinus issues in April and does not recall improvement with shoulder pain on this - No radiation of pain down arms; no numbness or tingling ROS: No weakness, no falls  #Eye pain: - Itchy, burning pain for last couple days - No change in vision - No known irritant exposure; denies foreign body sensation  #Sore throat: - Occurs intermittently and feels like there is a bump on his neck - Gets a hoarse throat at times - Takes zantac for GERD - Weaning off of cigarettes; smokes a few a day  No Known Allergies  Objective: Blood pressure 110/82, pulse 73, temperature 98.4 F (36.9 C), temperature source Oral, weight 153 lb (69.4 kg), SpO2 99 %. Body mass index is 25.46 kg/m. Constitutional: Well-appearing male in NAD HENT: MMM, swollen and erythematous nasal turbinates, normal posterior oropharynx, no cervical lymphadenopathy. Normal conjunctivae.  Cardiovascular: RRR, S1, S2, no m/r/g.  Pulmonary/Chest: Effort normal and breath sounds normal.  Musculoskeletal: FROM at shoulder and neck. No TTP over anterior or posterior shoulder. TTP over right shoulder blade. No midline spinal TTP. Neurological: AOx3, no focal deficits. Skin: Skin is warm and dry. No rash noted.  Psychiatric: Somewhat anxious affect.  Vitals  reviewed  Assessment/Plan: Upper back pain, chronic - Appears musculoskeletal in nature and patient with physical job. No weakness to suggest nerve injury. - Recommended trial of flexeril before bed. To continue ibuprofen prn. Will refer to physical therapy given chronicity of issue. Could also consider therapies like massage and topical NSAIDs or capsaicin.  Sore throat - Vital signs normal. Suspect from postnasal drip from seasonal allergies versus irritation from smoking or reflux. No fever to suggest infectious process.  - Continue zantac for GERD.  - Consider nasal steroid.   Pain of both eyes - Suspect mild allergic conjunctivitis. Prescribed pataday drops.   Follow-up prn.  Dani GobbleHillary Chieko Neises, MD [facility], PGY-3

## 2018-03-02 NOTE — Assessment & Plan Note (Signed)
-   Suspect mild allergic conjunctivitis. Prescribed pataday drops.

## 2018-03-02 NOTE — Assessment & Plan Note (Signed)
-   Vital signs normal. Suspect from postnasal drip from seasonal allergies versus irritation from smoking or reflux. No fever to suggest infectious process.  - Continue zantac for GERD.  - Consider nasal steroid.

## 2018-03-10 ENCOUNTER — Other Ambulatory Visit: Payer: Self-pay

## 2018-03-10 ENCOUNTER — Ambulatory Visit: Payer: BLUE CROSS/BLUE SHIELD | Admitting: Family Medicine

## 2018-03-10 VITALS — BP 130/80 | HR 72 | Temp 98.6°F | Wt 151.8 lb

## 2018-03-10 DIAGNOSIS — H1010 Acute atopic conjunctivitis, unspecified eye: Secondary | ICD-10-CM | POA: Insufficient documentation

## 2018-03-10 DIAGNOSIS — H1013 Acute atopic conjunctivitis, bilateral: Secondary | ICD-10-CM

## 2018-03-10 MED ORDER — FLUTICASONE PROPIONATE 50 MCG/ACT NA SUSP: 2.0000 | Freq: Every day | NASAL | 6 refills | Status: DC

## 2018-03-10 NOTE — Assessment & Plan Note (Signed)
Acute.  Mild in severity.  No signs of secondary bacterial infection. - Given prescription for Flonase, 2 sprays into nares bilaterally daily with instructions to continue Pataday as needed - Reviewed return precautions

## 2018-03-10 NOTE — Progress Notes (Signed)
   Subjective   Patient ID: Colin Butler    DOB: 1985/11/08, 32 y.o. male   MRN: [id number] [employer] interpreter used: Tommy Rainwaterhandia #[id number] Tery Sanfilippo([place])  CC: "Eye pain"  HPI: Colin Butler is a 32 y.o. male who presents to clinic today for the following:  EYE COMPLAINT  Eye symptoms started 2 weeks ago. Had similar issue in past with cold-like symptoms 7 months ago. Eye involved: both Eye symptom progression: unchanged since onset, constant Other people with same problem: no Medications tried:  Pataday drops without improvement Eye Trauma: no Contact Lens: no Recent eye surgeries: no  Symptoms Itching: yes Eye discharge or mattering: no Vision impairment: no Photophobia: no Nose discharge: yes, clear Sneezing: no Vomiting: no Rings around lights: no  ROS: see HPI for pertinent.  PMFSH: Chronic back pain.  Surgical history unremarkable.  Family history unremarkable.  Smoking status reviewed. Medications reviewed.  Objective   BP 130/80 (BP Location: Left Arm, Patient Position: Sitting, Cuff Size: Normal)   Pulse 72   Temp 98.6 F (37 C) (Oral)   Wt 151 lb 12.8 oz (68.9 kg)   SpO2 100%   BMI 25.26 kg/m  Vitals and nursing note reviewed.  General: well nourished, well developed, NAD with non-toxic appearance HEENT: normocephalic, atraumatic, moist mucous membranes, pink conjunctivo-bilaterally without matting or discharge, PERRLA, EOMI Neck: supple, non-tender without lymphadenopathy Cardiovascular: regular rate and rhythm without murmurs, rubs, or gallops Lungs: clear to auscultation bilaterally with normal work of breathing Skin: warm, dry, no rashes or lesions, cap refill < 2 seconds Extremities: warm and well perfused, normal tone, no edema  Assessment & Plan   Allergic conjunctivitis Acute.  Mild in severity.  No signs of secondary bacterial infection. - Given prescription for Flonase, 2 sprays into nares bilaterally daily with instructions to continue Pataday as  needed - Reviewed return precautions  No orders of the defined types were placed in this encounter.  Meds ordered this encounter  Medications  . fluticasone (FLONASE) 50 MCG/ACT nasal spray    Sig: Place 2 sprays into both nostrils daily.    Dispense:  16 g    Refill:  6    Durward Parcelavid Kiley Solimine, DO [facility], PGY-2 03/10/2018, 1:30 PM

## 2018-03-10 NOTE — Patient Instructions (Signed)
Thank you for coming in to see us today. Please see below to review our plan for today's visit.  Take the Flonase twice daily for 2 weeks.  Please call the clinic at [phone] if your symptoms worsen or you have any concerns. It was our pleasure to serve you.  Durward Parcelavid McMullen, DO [facility], PGY-2

## 2018-04-01 ENCOUNTER — Ambulatory Visit (HOSPITAL_COMMUNITY)
Admission: EM | Admit: 2018-04-01 | Discharge: 2018-04-01 | Disposition: A | Discharge status: Home / Self Care | Payer: BLUE CROSS/BLUE SHIELD | Attending: Family Medicine | Admitting: Family Medicine

## 2018-04-01 ENCOUNTER — Encounter (HOSPITAL_COMMUNITY): Payer: Self-pay | Admitting: Emergency Medicine

## 2018-04-01 ENCOUNTER — Other Ambulatory Visit: Payer: Self-pay

## 2018-04-01 DIAGNOSIS — R1013 Epigastric pain: Secondary | ICD-10-CM

## 2018-04-01 MED ORDER — ESOMEPRAZOLE MAGNESIUM 20 MG PO CPDR: 20.0000 mg | DELAYED_RELEASE_CAPSULE | Freq: Every day | ORAL | 0 refills | Status: DC

## 2018-04-01 MED ORDER — GI COCKTAIL ~~LOC~~
30.0000 mL | Freq: Once | ORAL | Status: AC
  Administered 2018-04-01: 30 mL via ORAL

## 2018-04-01 MED ORDER — GI COCKTAIL ~~LOC~~
ORAL | Status: AC
  Filled 2018-04-01: qty 30

## 2018-04-01 NOTE — ED Provider Notes (Signed)
[facility]    CSN: [id number] Arrival date & time: 04/01/18  1855     History   Chief Complaint Chief Complaint  Patient presents with  . Abdominal Pain    HPI Kincaid Tiger is a 32 y.o. male.   32 year old male comes in for 1 week history of intermittent epigastric pain.  States pain is burning sensation, intermittent, worse with eating.  Denies nausea, vomiting.  Denies constipation or diarrhea.  Normal bowel movements without straining.  Denies relief after bowel movement.  Denies fever, chills, night sweats.  Denies URI symptoms such as cough, congestion, sore throat.  Denies chronic NSAID use.  Occasional alcohol use.  Denies chest pain, shortness of breath, palpitations.  Never smoker.  Tried Zantac without relief.     History reviewed. No pertinent past medical history.  Patient Active Problem List   Diagnosis Date Noted  . Allergic conjunctivitis 03/10/2018  . Upper back pain, chronic 03/02/2018  . Sore throat 03/02/2018  . Pain of both eyes 03/02/2018    History reviewed. No pertinent surgical history.     Home Medications    Prior to Admission medications   Medication Sig Start Date End Date Taking? Authorizing Provider  esomeprazole (NEXIUM) 20 MG capsule Take 1 capsule (20 mg total) by mouth daily for 14 days. 04/01/18 04/15/18  Belinda Fisher, PA-C    Family History Family History  Family history unknown: Yes    Social History Social History   Tobacco Use  . Smoking status: Never Smoker  . Smokeless tobacco: Never Used  Substance Use Topics  . Alcohol use: No    Frequency: Never  . Drug use: Never     Allergies   Patient has no known allergies.   Review of Systems Review of Systems  Reason unable to perform ROS: See HPI as above.     Physical Exam Triage Vital Signs ED Triage Vitals  Enc Vitals Group     BP 04/01/18 1913 (!) 143/92     Pulse Rate 04/01/18 1913 73     Resp 04/01/18 1913 18     Temp 04/01/18 1913 98.2 F  (36.8 C)     Temp Source 04/01/18 1913 Oral     SpO2 04/01/18 1913 100 %     Weight --      Height --      Head Circumference --      Peak Flow --      Pain Score 04/01/18 1917 8     Pain Loc --      Pain Edu? --      Excl. in GC? --    No data found.  Updated Vital Signs BP (!) 143/92 (BP Location: Left Arm)   Pulse 73   Temp 98.2 F (36.8 C) (Oral)   Resp 18   SpO2 100%   Physical Exam  Constitutional: He is oriented to person, place, and time. He appears well-developed and well-nourished. No distress.  HENT:  Head: Normocephalic and atraumatic.  Cardiovascular: Normal rate, regular rhythm and normal heart sounds. Exam reveals no gallop and no friction rub.  No murmur heard. Pulmonary/Chest: Effort normal and breath sounds normal. He has no wheezes. He has no rales.  Abdominal: Soft. Bowel sounds are normal. He exhibits no mass.  Mild epigastric pain without guarding, rebound, rigidity.  Negative CVA tenderness.  Neurological: He is alert and oriented to person, place, and time.  Skin: Skin is warm and dry.  Psychiatric: He  has a normal mood and affect. His behavior is normal. Judgment normal.     UC Treatments / Results  Labs (all labs ordered are listed, but only abnormal results are displayed) Labs Reviewed - No data to display  EKG None  Radiology No results found.  Procedures Procedures (including critical care time)  Medications Ordered in UC Medications  gi cocktail (Maalox,Lidocaine,Donnatal) (30 mLs Oral Given 04/01/18 1949)    Initial Impression / Assessment and Plan / UC Course  I have reviewed the triage vital signs and the nursing notes.  Pertinent labs & imaging results that were available during my care of the patient were reviewed by me and considered in my medical decision making (see chart for details).    Patient with mild improvement of symptoms after GI cocktail.  Will have patient start Nexium as directed. Resources provided for  foods to avoid for GERD.  Return precautions given.  Patient expresse understanding and agrees to plan.s   Final Clinical Impressions(s) / UC Diagnoses   Final diagnoses:  Epigastric pain    ED Prescriptions    Medication Sig Dispense Auth. Provider   esomeprazole (NEXIUM) 20 MG capsule Take 1 capsule (20 mg total) by mouth daily for 14 days. 14 capsule Threasa AlphaYu, Eloni Darius V, PA-C        Anuja Manka V, [place]PA-C 04/01/18 2035

## 2018-04-01 NOTE — Discharge Instructions (Signed)
Symptoms most consistent with acid reflux. Start nexium as directed. See attached document for foods and drinks to avoid. As discussed, do not eat 2 hours prior to sleeping. Stay up right 30-8260mins after eating your meals. Follow up with PCP for further evaluation if symptoms not improving. If experiencing worsening symptoms, chest pain, shortness of breath, palpitations, nausea/vomiting, abdominal pain so severe he does not want to jump up and down, go to the emergency department for further evaluation needed.

## 2018-04-01 NOTE — ED Triage Notes (Addendum)
Abdominal pain for one week.  No vomiting.  2 days ago had a bowel movement, no relief.  Pain is center, epigastric area .  Pain is described as a burning sensation

## 2018-04-03 ENCOUNTER — Ambulatory Visit: Payer: BLUE CROSS/BLUE SHIELD | Admitting: Family Medicine

## 2018-04-03 DIAGNOSIS — K219 Gastro-esophageal reflux disease without esophagitis: Secondary | ICD-10-CM

## 2018-04-03 DIAGNOSIS — M549 Dorsalgia, unspecified: Secondary | ICD-10-CM | POA: Diagnosis not present

## 2018-04-03 DIAGNOSIS — G8929 Other chronic pain: Secondary | ICD-10-CM

## 2018-04-03 MED ORDER — BACLOFEN 5 MG PO TABS: 5.0000 mg | ORAL_TABLET | Freq: Three times a day (TID) | ORAL | 0 refills | Status: DC | PRN

## 2018-04-03 NOTE — Progress Notes (Signed)
Patient presents today for a follow up from stomach pains at Urgent Care. Patient inquired about physical therapy on a referral that was placed 02/27/2018. Patient states that he was never called for an appointment.  I placed a call to [facility] at [phone] located at [address] I left a message and informed the patient that I will call him back after I make an appointment for him. Patient sates that any day or time is acceptable.  Glennie Hawk.Simpson, Michelle R, CMA

## 2018-04-03 NOTE — Patient Instructions (Signed)
It was a pleasure seeing you today.   Today we discussed your stomach pain and back pain  For your stomach pain: continue the nexium that was given to you at urgent care. Take this daily  For your back pain: please use baclofen three times a day as needed. I recommend using before bed as this will make you sleepy. DO NOT OPERATE MACHINERY OR A MOVING VEHICLE after taking medication.   Please follow up if no improvement or sooner if symptoms persist or worsen. Please call the clinic immediately if you have any concerns.   Our clinic's number is [phone]. Please call with questions or concerns.    Thank you,  Colin ManisSherin Rye Decoste, DO

## 2018-04-03 NOTE — Assessment & Plan Note (Addendum)
Appears musculoskeletal in nature as patient has history of having a job.  No weakness on exam and full range of motion.  Will give trial of baclofen as patient did not like Flexeril.  Informed patient that this will make him drowsy and to use before bed and to not operate machinery or drive.  Encourage patient to not use ibuprofen as this will exacerbate GERD symptoms.  Encourage patient to follow-up with physical therapy. -baclofen 5mg  tid PRN -follow up with physical therapy. Have contacted PT office to help schedule this, awaiting call back -tylenol PRN

## 2018-04-03 NOTE — Progress Notes (Signed)
   Subjective:    Patient ID: Colin Butler, male    DOB: 1986-09-09, 32 y.o.   MRN: [id number]   CC: stomach pain and back pain   HPI: Stomach pain Patient presenting today for follow-up of stomach pain after being seen in urgent care.  Seen in Urgent Care on 04/01/18 and diagnosed with GERD. Patient discharged with RX for esomeprazole daily.  Patient says before this he had 2 to 3 days of abdominal pain and chest pain (but points to epigastric area when he describes chest pain).  Says that ever since taking medications he no longer has the symptoms.  Says chest pain has resolved and only comes back up when he does not take medications.  Patient reports pain is worse at bedtime.  Patient reports burping.  Patient denies nausea vomiting diarrhea.  Right Shoulder pain Patient presenting today with continued right shoulder pain but points to his back.  Patient was seen on 6/21 by Dr. Sampson GoonFitzgerald and was recommended at that time for Flexeril, ibuprofen, and follow-up with physical therapy.  Patient says he has not been contacted by physical therapy so he has not gone yet.  Patient says pain has been occurring for 5 years.  Patient is unable to describe characterization of pain just says it is pain.  Patient also unable to truly clarify if Flexeril helps pain but says he does not think it helped.  She also says pain radiates to neck.  Objective:  BP 115/75 (BP Location: Right Arm, Patient Position: Sitting, Cuff Size: Normal)   Pulse 73   Temp 98.5 F (36.9 C) (Oral)   Wt 151 lb 9.6 oz (68.8 kg)   SpO2 100%   BMI 25.23 kg/m  Vitals and nursing note reviewed  General: well nourished, in no acute distress HEENT: normocephalic, moist mucous membranes Neck: supple, non-tender, without lymphadenopathy Abdomen: soft, slight tenderness to palpation of epigastric region, nondistended, no masses or organomegaly. Bowel sounds present.  Extremities: no edema or cyanosis. Warm, well perfused.  MSK:  Tenderness to palpation of right posterior trapezius muscle into right sternocleidomastoid.  No paraspinal tenderness.  Full passive and active range of motion.  Tenderness with passive range of motion in extension and abduction.  No warmth or edema noted. 5/5 strength Skin: warm and dry, no rashes noted Neuro: alert and oriented, no focal deficits    Assessment & Plan:    GERD (gastroesophageal reflux disease) Patient reports resolution of symptoms with Nexium.  Informed patient to continue Nexium daily as missing doses causes recurrence of chest pain and abdominal pain.  No EKG needed at this time as chest pain is actually epigastric pain.  -Continue Nexium  Upper back pain, chronic Appears musculoskeletal in nature as patient has history of having a job.  No weakness on exam and full range of motion.  Will give trial of baclofen as patient did not like Flexeril.  Informed patient that this will make him drowsy and to use before bed and to not operate machinery or drive.  Encourage patient to not use ibuprofen as this will exacerbate GERD symptoms.  Encourage patient to follow-up with physical therapy. -baclofen 5mg  tid PRN -follow up with physical therapy. Have contacted PT office to help schedule this, awaiting call back -tylenol PRN     Return if symptoms worsen or fail to improve.   Oralia ManisSherin Tama Grosz, DO, PGY-2

## 2018-04-03 NOTE — Assessment & Plan Note (Signed)
Patient reports resolution of symptoms with Nexium.  Informed patient to continue Nexium daily as missing doses causes recurrence of chest pain and abdominal pain.  No EKG needed at this time as chest pain is actually epigastric pain.  -Continue Nexium

## 2018-04-08 ENCOUNTER — Ambulatory Visit: Payer: BLUE CROSS/BLUE SHIELD | Admitting: Family Medicine

## 2018-04-24 ENCOUNTER — Ambulatory Visit: Payer: BLUE CROSS/BLUE SHIELD | Admitting: Family Medicine

## 2018-05-04 ENCOUNTER — Ambulatory Visit: Payer: BLUE CROSS/BLUE SHIELD | Admitting: Family Medicine

## 2018-05-05 ENCOUNTER — Ambulatory Visit: Payer: BLUE CROSS/BLUE SHIELD | Admitting: Family Medicine

## 2018-05-07 ENCOUNTER — Ambulatory Visit (HOSPITAL_COMMUNITY)
Admission: EM | Admit: 2018-05-07 | Discharge: 2018-05-07 | Disposition: A | Discharge status: Home / Self Care | Payer: BLUE CROSS/BLUE SHIELD | Attending: Family Medicine | Admitting: Family Medicine

## 2018-05-07 ENCOUNTER — Encounter (HOSPITAL_COMMUNITY): Payer: Self-pay

## 2018-05-07 ENCOUNTER — Other Ambulatory Visit: Payer: Self-pay

## 2018-05-07 DIAGNOSIS — M545 Low back pain, unspecified: Secondary | ICD-10-CM

## 2018-05-07 DIAGNOSIS — K219 Gastro-esophageal reflux disease without esophagitis: Secondary | ICD-10-CM

## 2018-05-07 DIAGNOSIS — R1013 Epigastric pain: Secondary | ICD-10-CM | POA: Diagnosis not present

## 2018-05-07 LAB — POCT URINALYSIS DIP (DEVICE)
BILIRUBIN URINE: NEGATIVE
Glucose, UA: NEGATIVE mg/dL
Hgb urine dipstick: NEGATIVE
KETONES UR: NEGATIVE mg/dL
Leukocytes, UA: NEGATIVE
NITRITE: NEGATIVE
PROTEIN: NEGATIVE mg/dL
Specific Gravity, Urine: 1.015 (ref 1.005–1.030)
Urobilinogen, UA: 0.2 mg/dL (ref 0.0–1.0)
pH: 7 (ref 5.0–8.0)

## 2018-05-07 MED ORDER — RANITIDINE HCL 150 MG PO CAPS: 150.0000 mg | ORAL_CAPSULE | Freq: Every day | ORAL | 0 refills | Status: DC

## 2018-05-07 MED ORDER — OMEPRAZOLE 20 MG PO CPDR: 20.0000 mg | DELAYED_RELEASE_CAPSULE | Freq: Every day | ORAL | 1 refills | Status: DC

## 2018-05-07 MED ORDER — GI COCKTAIL ~~LOC~~
ORAL | Status: AC
Start: 2018-05-07 — End: ?
  Filled 2018-05-07: qty 30

## 2018-05-07 MED ORDER — KETOROLAC TROMETHAMINE 30 MG/ML IJ SOLN
INTRAMUSCULAR | Status: AC
  Filled 2018-05-07: qty 1

## 2018-05-07 MED ORDER — GI COCKTAIL ~~LOC~~
30.0000 mL | Freq: Once | ORAL | Status: AC
  Administered 2018-05-07: 30 mL via ORAL

## 2018-05-07 MED ORDER — KETOROLAC TROMETHAMINE 30 MG/ML IJ SOLN
30.0000 mg | Freq: Once | INTRAMUSCULAR | Status: AC
  Administered 2018-05-07: 30 mg via INTRAMUSCULAR

## 2018-05-07 MED ORDER — NAPROXEN 500 MG PO TABS: 500.0000 mg | ORAL_TABLET | Freq: Two times a day (BID) | ORAL | 0 refills | Status: DC

## 2018-05-07 NOTE — Discharge Instructions (Addendum)
I believe your symptoms are associated with acid reflux.  We are going to try omeprazole 20 mg once daily and zantac 150 mg at night.  Make sure that you take the omeprazole 30- 60 minutes prior to a meal with a glass of water.  Avoid spicy, greasy foods, caffeine, chocolate and milk products.  No eating 2-3 hours before bedtime. Elevate the head of the bed 30 degrees.  Try this for a few weeks to see if this improves your symptoms.  If you don't see any improvement or your symptoms worsen please follow up with a GI   I am also giving you some naproxen for lower back pain  Vi?v?sa garnuh?s tap?'im?k? lak?a?ahar? ?si?a riphlaksasam?ga sambandhita chan. H?m? omeprazole 20 mil?gr?ma dainika ?ka pa?aka ra zantac 1 mg0 mil?gr?ma r?t?m? k??isa gardaichau?. Ni?cita garnuh?s ki tap?'?nl? ?m?pr?j?la take0- 600 min??a kh?n? kh?na aghi ?ka gil?sa p?n?k? s?tha linuhuncha. Masal?d?ra, cill? pad?rtha, ky?phina, cakal??a ra d?dha utp?danahar? v?v?st? garnuh?s. Sutn? b?l?m? 2- hours gha??? sam'ma kh?na hum?daina. ?chy?nak? ??'[place]? 300 ?igr? ba?h?'unuh?s. K?hi hapt?k? l?gi pray?sa garnuh?s yadi yasal? tap?'??k? lak?a?ahar?m? sudh?ra ly?'um?cha bhan?Colin Butler. Yadi tap?'?nl? kunai sudh?ra d?khnubha'?na v? tap?'??k? lak?a?ahar? bigr?k? chaina bhan? kr?pay? j?'?'?k? s?tha anusara?a garnuh?s Tall? p??ha dukhn? kramam? ma tap?'?nl?'? k?hi n?pr?ks?na didaichu

## 2018-05-07 NOTE — ED Triage Notes (Signed)
Pt states he has stomach pain and back pain x 2 days.

## 2018-05-08 NOTE — ED Provider Notes (Signed)
[facility]    CSN: [id number] Arrival date & time: 05/07/18  1447     History   Chief Complaint Chief Complaint  Patient presents with  . Abdominal Pain  . Back Pain    HPI Colin Butler is a 32 y.o. male.   Patient is a 32 year old male with past medical history of GERD, chronic back pain.  He presents today with 3 days of epigastric pain, burning.  Symptoms have been constant and remain the same.  He has not taken anything for his symptoms.  He reports this feels similar to his past problems with acid reflux.  He denies any nausea, vomiting, diarrhea.   Patient is also here for lower back pain.  This also started 3 days ago when he was at work.  He denies any heavy lifting, injury to the back.  He denies any numbness, tingling, weakness, loss of bowel or bladder function.  He denies any dysuria, hematuria, urinary frequency.  He denies any fever, chills, body aches, fatigue, night sweats.  He has not taken anything for the back pain  He does not smoke  ROS per HPI      History reviewed. No pertinent past medical history.  Patient Active Problem List   Diagnosis Date Noted  . GERD (gastroesophageal reflux disease) 04/03/2018  . Allergic conjunctivitis 03/10/2018  . Upper back pain, chronic 03/02/2018  . Sore throat 03/02/2018  . Pain of both eyes 03/02/2018    History reviewed. No pertinent surgical history.     Home Medications    Prior to Admission medications   Medication Sig Start Date End Date Taking? Authorizing Provider  Baclofen 5 MG TABS Take 5 mg by mouth 3 (three) times daily as needed. Patient not taking: Reported on 05/07/2018 04/03/18   Oralia Manis, DO  esomeprazole (NEXIUM) 20 MG capsule Take 1 capsule (20 mg total) by mouth daily for 14 days. 04/01/18 04/15/18  Cathie Hoops, Amy V, PA-C  naproxen (NAPROSYN) 500 MG tablet Take 1 tablet (500 mg total) by mouth 2 (two) times daily. 05/07/18   Dahlia Byes A, NP  omeprazole (PRILOSEC) 20 MG capsule  Take 1 capsule (20 mg total) by mouth daily. 05/07/18   Dahlia Byes A, NP  ranitidine (ZANTAC) 150 MG capsule Take 1 capsule (150 mg total) by mouth daily. 05/07/18   Janace Aris, NP    Family History Family History  Family history unknown: Yes    Social History Social History   Tobacco Use  . Smoking status: Never Smoker  . Smokeless tobacco: Never Used  Substance Use Topics  . Alcohol use: No    Frequency: Never  . Drug use: Never     Allergies   Patient has no known allergies.   Review of Systems Review of Systems   Physical Exam Triage Vital Signs ED Triage Vitals  Enc Vitals Group     BP 05/07/18 1518 128/80     Pulse Rate 05/07/18 1518 76     Resp 05/07/18 1518 18     Temp 05/07/18 1518 97.9 F (36.6 C)     Temp src --      SpO2 05/07/18 1518 100 %     Weight --      Height --      Head Circumference --      Peak Flow --      Pain Score 05/07/18 1517 8     Pain Loc --      Pain Edu? --  Excl. in GC? --    No data found.  Updated Vital Signs BP 128/80 (BP Location: Right Arm)   Pulse 76   Temp 97.9 F (36.6 C)   Resp 18   SpO2 100%   Visual Acuity Right Eye Distance:   Left Eye Distance:   Bilateral Distance:    Right Eye Near:   Left Eye Near:    Bilateral Near:     Physical Exam  Constitutional: He appears well-developed and well-nourished. No distress.  Very pleasant nontoxic or ill-appearing.  HENT:  Head: Normocephalic and atraumatic.  Pulmonary/Chest: Effort normal.  Abdominal: Normal appearance and bowel sounds are normal. There is tenderness in the epigastric area. There is no rigidity, no rebound, no guarding, no CVA tenderness, no tenderness at McBurney's point and negative Murphy's sign.  Tenderness to palpation of epigastric area  Neurological: He is alert.  Skin: Skin is warm and dry.  Psychiatric: He has a normal mood and affect.  Nursing note and vitals reviewed.    UC Treatments / Results  Labs (all labs  ordered are listed, but only abnormal results are displayed) Labs Reviewed  POCT URINALYSIS DIP (DEVICE)    EKG None  Radiology No results found.  Procedures Procedures (including critical care time)  Medications Ordered in UC Medications  gi cocktail (Maalox,Lidocaine,Donnatal) (30 mLs Oral Given 05/07/18 1602)  ketorolac (TORADOL) 30 MG/ML injection 30 mg (30 mg Intramuscular Given 05/07/18 1602)    Initial Impression / Assessment and Plan / UC Course  I have reviewed the triage vital signs and the nursing notes.  Pertinent labs & imaging results that were available during my care of the patient were reviewed by me and considered in my medical decision making (see chart for details).     Most likely GERD due to patient's assessment and history.  GI cocktail given in clinic.  Patient experienced relief of symptoms from this.  We will have him start omeprazole daily and ranitidine as needed at night.  Lower back pain most likely lower lumbar strain.  He can take naproxen twice daily for his symptoms. Final Clinical Impressions(s) / UC Diagnoses   Final diagnoses:  Gastroesophageal reflux disease without esophagitis  Acute midline low back pain without sciatica     Discharge Instructions     I believe your symptoms are associated with acid reflux.  We are going to try omeprazole 20 mg once daily and zantac 150 mg at night.  Make sure that you take the omeprazole 30- 60 minutes prior to a meal with a glass of water.  Avoid spicy, greasy foods, caffeine, chocolate and milk products.  No eating 2-3 hours before bedtime. Elevate the head of the bed 30 degrees.  Try this for a few weeks to see if this improves your symptoms.  If you don't see any improvement or your symptoms worsen please follow up with a GI   I am also giving you some naproxen for lower back pain  Vi?v?sa garnuh?s tap?'im?k? lak?a?ahar? ?si?a riphlaksasam?ga sambandhita chan. H?m? omeprazole 20 mil?gr?ma  dainika ?ka pa?aka ra zantac 1 mg0 mil?gr?ma r?t?m? k??isa gardaichau?. Ni?cita garnuh?s ki tap?'?nl? ?m?pr?j?la take0- 600 min??a kh?n? kh?na aghi ?ka gil?sa p?n?k? s?tha linuhuncha. Masal?d?ra, cill? pad?rtha, ky?phina, cakal??a ra d?dha utp?danahar? v?v?st? garnuh?s. Sutn? b?l?m? 2- hours gha??? sam'ma kh?na hum?daina. ?chy?nak? ??'[place]? 300 ?igr? ba?h?'unuh?s. K?hi hapt?k? l?gi pray?sa garnuh?s yadi yasal? tap?'??k? lak?a?ahar?m? sudh?ra ly?'um?cha bhan?Marguerita Merles. Yadi tap?'?nl? kunai sudh?ra d?khnubha'?na v? tap?'??k? lak?a?ahar? bigr?k? chaina bhan? kr?pay? j?'?'?k?  s?tha anusara?a garnuh?s Tall? p??ha dukhn? kramam? ma tap?'?nl?'? k?hi n?pr?ks?na didaichu   ED Prescriptions    Medication Sig Dispense Auth. Provider   omeprazole (PRILOSEC) 20 MG capsule Take 1 capsule (20 mg total) by mouth daily. 30 capsule Oretha Weismann A, NP   ranitidine (ZANTAC) 150 MG capsule Take 1 capsule (150 mg total) by mouth daily. 30 capsule Chevon Laufer A, NP   naproxen (NAPROSYN) 500 MG tablet Take 1 tablet (500 mg total) by mouth 2 (two) times daily. 30 tablet Dahlia Byes A, NP     Controlled Substance Prescriptions [place] Controlled Substance Registry consulted? Not Applicable   Janace Aris, NP 05/08/18 [id number]

## 2018-05-21 ENCOUNTER — Ambulatory Visit (INDEPENDENT_AMBULATORY_CARE_PROVIDER_SITE_OTHER): Payer: BLUE CROSS/BLUE SHIELD | Admitting: Family Medicine

## 2018-05-21 ENCOUNTER — Other Ambulatory Visit: Payer: Self-pay

## 2018-05-21 ENCOUNTER — Encounter: Payer: Self-pay | Admitting: *Deleted

## 2018-05-21 VITALS — BP 110/70 | HR 70 | Temp 98.6°F

## 2018-05-21 DIAGNOSIS — Z789 Other specified health status: Secondary | ICD-10-CM | POA: Insufficient documentation

## 2018-05-21 DIAGNOSIS — K219 Gastro-esophageal reflux disease without esophagitis: Secondary | ICD-10-CM | POA: Diagnosis not present

## 2018-05-21 NOTE — Patient Instructions (Addendum)
?????????????, ????? ????????????? ????? ???? (????) ??? ?? ???????? ?? ?????? ?????, ????? ?? ????????? (????????): ?????? ????? ?????? ??????? ??? ?? ?? ???????? ?????? ?????? ????? ?????? ????? ????? (?????) ????????? (?????) ????? ??? ??? ??? ???? ?????? ??????  ?? ???????? ???? ????? ??? ?????????? ?? ????? ????, ??????? ??? ??? ??? ????, ? ????? ????? ??? ?? ??? (?????) ??????? ???? ??????????? ?? ??????????? ???? ????? ?????????: ???-?-??????? ? ?????????????? ???? ???????? ????? ??????? ???????? ??? ??????? ??? ???????? ???????????? ???? ??????? ?????? ???? ???, ????? ??????? ???????? ?????? ????????? ????? ?????? ?????????? ??? ??? ????? ??? ???????????? ??????? ????? (?????) ??? ??? ?? ?????? ?????? ????? ???????? ??? ?????? ?????????? ???? ???? ????? ????? ????? ???? ???? ???? ?? ????????? ???? ?????????? ??????? ????????? ???: ?????? ?????? ??? ???? ?????? ?????? ????????? ???? ??? ? ?????? ???? ????? ?? ????? ???????? ???: ????? ??? ?? ??? ????? ????? ??????? ?? ???? ???????????? ???????? ???? ?? ???? ???? ?? (?????) ???? ????? ??? ?????? ?? ????? ??????????? ?????? ??? ?????? ??????? ????? ???? ?? ???? ? ????? ??? ?????? ????? ??????????? ?? ??????? ?????? ????????? ???? ??????? ?????? ????? ?????? ??????? ???????? ??? ?? ??????? ????????? ?? ??????? ??????? ????????? ???? ??????? ??? ???? ????????? ???? ???? ????????? ?????? ????? ???: ?/  0/?5/???9 ?????? ???????: ?7/? Review/??? Document ?????? ???????: ? /10 / ?? / ???? ????????? ?????????? ?????? ??????  ??? El ????????? ???? Gastritis, Adult Gastritis is swelling (inflammation) of the stomach. When you have this condition, you can have these problems (symptoms):  Pain in your stomach.  A burning feeling in your stomach.  Feeling sick to your stomach (nauseous).  Throwing up (vomiting).  Feeling too full after you eat.  It is important to get help for this condition. Without help, your stomach can bleed, and  you can get sores (ulcers) in your stomach. Follow these instructions at home:  Take over-the-counter and prescription medicines only as told by your doctor.    If you were prescribed an antibiotic medicine, take it as told by your doctor. Do not stop taking it even if you start to feel better.  Drink enough fluid to keep your pee (urine) clear or pale yellow.  Instead of eating big meals, eat small meals often. Contact a health care provider if:  Your problems get worse.  Your problems go away and then come back. Get help right away if:  You throw up blood or something that looks like coffee grounds.  You have black or dark red poop (stools).  You cannot keep fluids down.  Your stomach pain gets worse.  You have a fever.  You do not feel better after 1 week. This information is not intended to replace advice given to you by your health care provider. Make sure you discuss any questions you have with your health care provider. Document Released: 02/12/2008 Document Revised: 04/24/2016 Document Reviewed: 05/20/2015 Elsevier Interactive Patient Education  [employer]2018 [employer]

## 2018-05-21 NOTE — Assessment & Plan Note (Signed)
Continue PPI and H2 blocker. Check CMP and H. Pylori and RUQ u/s to r/o gallbladder pathology. Normal dextrocardia, normal O 2 sats rule against cardiac or PE as etiology.

## 2018-05-21 NOTE — Progress Notes (Signed)
   Subjective:    Patient ID: Colin Butler is a 32 y.o. male presenting with Abdominal Pain  on 05/21/2018  HPI: Comes in today after not sleeping well last pm. Noted pain in the evening associated with SOB and chest pain. Seen in late August with GERD and epigastric symptoms. Has been on PPI and H2 blocker which has given some relief. Pain is deep and radiates to arm. Though right arm seems to have been having bothering him x 4 years. Denies nausea, or bowel changes. 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 Cannot say what is exacerbating or alleviating.  Review of Systems  Constitutional: Negative for chills and fever.  Respiratory: Negative for shortness of breath.   Cardiovascular: Negative for leg swelling.  Gastrointestinal: Positive for abdominal pain. Negative for constipation, diarrhea, nausea and vomiting.      Objective:    BP 110/70   Pulse 70   Temp 98.6 F (37 C) (Oral)   SpO2 99%  Physical Exam  Constitutional: He is oriented to person, place, and time. He appears well-developed and well-nourished. No distress.  HENT:  Head: Normocephalic and atraumatic.  Eyes: Conjunctivae are normal. No scleral icterus.  Neck: Neck supple. No thyromegaly present.  Cardiovascular: Normal rate and regular rhythm.  No murmur heard. Pulmonary/Chest: Effort normal and breath sounds normal. He has no wheezes. He exhibits no tenderness.  Clear to percussion  Abdominal: Soft. He exhibits no mass. There is no hepatosplenomegaly. There is tenderness in the right upper quadrant and epigastric area. There is no rebound.  Musculoskeletal: Normal range of motion. He exhibits no edema.  Neurological: He is alert and oriented to person, place, and time.  Skin: Skin is warm and dry. No rash noted.  Psychiatric: He has a normal mood and affect. His behavior is normal.  Vitals reviewed.       Assessment & Plan:   Problem List Items Addressed This Visit      Unprioritized   GERD (gastroesophageal  reflux disease) - Primary    Continue PPI and H2 blocker. Check CMP and H. Pylori and RUQ u/s to r/o gallbladder pathology. Normal dextrocardia, normal O 2 sats rule against cardiac or PE as etiology.      Relevant Orders   H Pylori, IGM, IGG, IGA AB   Comprehensive metabolic panel   US ABDOMEN LIMITED RUQ   Language barrier affecting health care    Video Nepali interpreter # [phone] Binod used  Total face-to-face time with patient: 15 minutes. Over 50% of encounter was spent on counseling and coordination of care. Return in about 4 weeks (around 06/18/2018).  Reva Boresanya S Smrithi Pigford 05/21/2018 2:07 PM

## 2018-05-22 MED ORDER — AMOXICILLIN 500 MG PO CAPS: 1000.0000 mg | ORAL_CAPSULE | Freq: Two times a day (BID) | ORAL | 0 refills | Status: AC

## 2018-05-22 MED ORDER — CLARITHROMYCIN ER 500 MG PO TB24: 500.0000 mg | ORAL_TABLET | Freq: Two times a day (BID) | ORAL | 0 refills | Status: AC

## 2018-05-22 NOTE — Addendum Note (Signed)
Addended by: Reva BoresPRATT, Lorayne Getchell S on: 05/22/2018 05:12 PM   Modules accepted: Orders

## 2018-05-23 LAB — H PYLORI, IGM, IGG, IGA AB: H PYLORI IGG: 0.92 {index_val} — AB (ref 0.00–0.79)

## 2018-05-23 LAB — COMPREHENSIVE METABOLIC PANEL
ALBUMIN: 4.5 g/dL (ref 3.5–5.5)
ALK PHOS: 87 IU/L (ref 39–117)
ALT: 22 IU/L (ref 0–44)
AST: 17 IU/L (ref 0–40)
Albumin/Globulin Ratio: 1.5 (ref 1.2–2.2)
BUN / CREAT RATIO: 9 (ref 9–20)
BUN: 8 mg/dL (ref 6–20)
Bilirubin Total: 0.4 mg/dL (ref 0.0–1.2)
CO2: 25 mmol/L (ref 20–29)
CREATININE: 0.85 mg/dL (ref 0.76–1.27)
Calcium: 9.6 mg/dL (ref 8.7–10.2)
Chloride: 99 mmol/L (ref 96–106)
GFR calc Af Amer: 135 mL/min/{1.73_m2} (ref >59)
GFR, EST NON AFRICAN AMERICAN: 117 mL/min/{1.73_m2} (ref >59)
GLUCOSE: 107 mg/dL — AB (ref 65–99)
Globulin, Total: 3 g/dL (ref 1.5–4.5)
Potassium: 4 mmol/L (ref 3.5–5.2)
SODIUM: 138 mmol/L (ref 134–144)
Total Protein: 7.5 g/dL (ref 6.0–8.5)

## 2018-05-25 ENCOUNTER — Ambulatory Visit (HOSPITAL_COMMUNITY)
Admission: RE | Admit: 2018-05-25 | Discharge: 2018-05-25 | Disposition: A | Discharge status: Home / Self Care | Payer: BLUE CROSS/BLUE SHIELD | Source: Ambulatory Visit | Attending: Family Medicine | Admitting: Family Medicine

## 2018-05-25 DIAGNOSIS — R1011 Right upper quadrant pain: Secondary | ICD-10-CM | POA: Insufficient documentation

## 2018-05-25 DIAGNOSIS — K219 Gastro-esophageal reflux disease without esophagitis: Secondary | ICD-10-CM

## 2018-05-26 ENCOUNTER — Telehealth: Payer: Self-pay | Admitting: *Deleted

## 2018-05-26 NOTE — Telephone Encounter (Signed)
[employer] interpreter [phone].  LM for patient letting him know that his ultrasound was normal and also we sent in 2 medications to the pharmacy to help with his stomach pain.  Asked patient to call back with any questions.  He has a follow up appt on 06-02-18 with PCP.  Rhondalyn Clingan,CMA

## 2018-05-26 NOTE — Telephone Encounter (Signed)
-----   Message from Reva Boresanya S Pratt, MD sent at 05/22/2018  5:12 PM EDT ----- Has positive H. Pylori--will need treatment--continue Nexium--added Clarithro and Amox--order sent to pharmacy--please inform him--needs Nepali interpreter.

## 2018-06-02 ENCOUNTER — Ambulatory Visit: Payer: BLUE CROSS/BLUE SHIELD | Admitting: Family Medicine

## 2018-07-14 ENCOUNTER — Other Ambulatory Visit: Payer: Self-pay

## 2018-07-14 ENCOUNTER — Emergency Department (HOSPITAL_COMMUNITY)
Admission: EM | Admit: 2018-07-14 | Discharge: 2018-07-14 | Disposition: A | Discharge status: Home / Self Care | Payer: BLUE CROSS/BLUE SHIELD | Attending: Emergency Medicine | Admitting: Emergency Medicine

## 2018-07-14 ENCOUNTER — Emergency Department (HOSPITAL_COMMUNITY): Payer: BLUE CROSS/BLUE SHIELD

## 2018-07-14 ENCOUNTER — Encounter (HOSPITAL_COMMUNITY): Payer: Self-pay | Admitting: Emergency Medicine

## 2018-07-14 DIAGNOSIS — Z79899 Other long term (current) drug therapy: Secondary | ICD-10-CM | POA: Insufficient documentation

## 2018-07-14 DIAGNOSIS — R1011 Right upper quadrant pain: Secondary | ICD-10-CM | POA: Diagnosis present

## 2018-07-14 DIAGNOSIS — K219 Gastro-esophageal reflux disease without esophagitis: Secondary | ICD-10-CM

## 2018-07-14 LAB — CBC
HEMATOCRIT: 45.3 % (ref 39.0–52.0)
HEMOGLOBIN: 15 g/dL (ref 13.0–17.0)
MCH: 27.4 pg (ref 26.0–34.0)
MCHC: 33.1 g/dL (ref 30.0–36.0)
MCV: 82.8 fL (ref 80.0–100.0)
Platelets: 266 10*3/uL (ref 150–400)
RBC: 5.47 MIL/uL (ref 4.22–5.81)
RDW: 12 % (ref 11.5–15.5)
WBC: 10 10*3/uL (ref 4.0–10.5)
nRBC: 0 % (ref 0.0–0.2)

## 2018-07-14 LAB — COMPREHENSIVE METABOLIC PANEL
ALBUMIN: 4.7 g/dL (ref 3.5–5.0)
ALT: 37 U/L (ref 0–44)
AST: 29 U/L (ref 15–41)
Alkaline Phosphatase: 90 U/L (ref 38–126)
Anion gap: 11 (ref 5–15)
BUN: 6 mg/dL (ref 6–20)
CHLORIDE: 100 mmol/L (ref 98–111)
CO2: 26 mmol/L (ref 22–32)
CREATININE: 0.84 mg/dL (ref 0.61–1.24)
Calcium: 9.8 mg/dL (ref 8.9–10.3)
GFR calc Af Amer: >60 mL/min (ref >60)
GFR calc non Af Amer: >60 mL/min (ref >60)
GLUCOSE: 122 mg/dL — AB (ref 70–99)
POTASSIUM: 3.6 mmol/L (ref 3.5–5.1)
Sodium: 137 mmol/L (ref 135–145)
Total Bilirubin: 0.5 mg/dL (ref 0.3–1.2)
Total Protein: 8.4 g/dL — ABNORMAL HIGH (ref 6.5–8.1)

## 2018-07-14 LAB — LIPASE, BLOOD: Lipase: 37 U/L (ref 11–51)

## 2018-07-14 MED ORDER — PROMETHAZINE HCL 25 MG/ML IJ SOLN
25.0000 mg | Freq: Once | INTRAMUSCULAR | Status: AC
  Administered 2018-07-14: 25 mg via INTRAMUSCULAR

## 2018-07-14 MED ORDER — PROMETHAZINE HCL 25 MG/ML IJ SOLN
25.0000 mg | Freq: Once | INTRAMUSCULAR | Status: DC
  Filled 2018-07-14: qty 1

## 2018-07-14 MED ORDER — LIDOCAINE VISCOUS HCL 2 % MT SOLN
15.0000 mL | Freq: Once | OROMUCOSAL | Status: AC
  Administered 2018-07-14: 15 mL via ORAL
  Filled 2018-07-14: qty 15

## 2018-07-14 MED ORDER — HYOSCYAMINE SULFATE 0.125 MG SL SUBL
0.2500 mg | SUBLINGUAL_TABLET | Freq: Once | SUBLINGUAL | Status: AC
  Administered 2018-07-14: 0.25 mg via SUBLINGUAL
  Filled 2018-07-14: qty 2

## 2018-07-14 MED ORDER — ALUM & MAG HYDROXIDE-SIMETH 200-200-20 MG/5ML PO SUSP
30.0000 mL | Freq: Once | ORAL | Status: DC
Start: 2018-07-14 — End: 2018-07-14
  Filled 2018-07-14: qty 30

## 2018-07-14 MED ORDER — OMEPRAZOLE 20 MG PO CPDR: 20.0000 mg | DELAYED_RELEASE_CAPSULE | Freq: Every day | ORAL | 1 refills | Status: DC

## 2018-07-14 MED ORDER — ALUM & MAG HYDROXIDE-SIMETH 200-200-20 MG/5ML PO SUSP
30.0000 mL | Freq: Once | ORAL | Status: AC
  Administered 2018-07-14: 30 mL via ORAL

## 2018-07-14 MED ORDER — RANITIDINE HCL 150 MG PO CAPS: 150.0000 mg | ORAL_CAPSULE | Freq: Every day | ORAL | 0 refills | Status: DC

## 2018-07-14 MED ORDER — DICYCLOMINE HCL 10 MG/5ML PO SOLN
10.0000 mg | Freq: Once | ORAL | Status: DC
  Filled 2018-07-14 (×2): qty 5

## 2018-07-14 NOTE — ED Notes (Signed)
Patient verbalizes understanding of discharge instructions. Opportunity for questioning and answers were provided. Armband removed by staff, pt discharged from ED. Pt ambulatory to lobby with family.  

## 2018-07-14 NOTE — ED Notes (Signed)
Pt came to nurse first, holding stomach, c/o pain and nausea.  Orders received by Kerrie Buffalo, NP.

## 2018-07-14 NOTE — ED Triage Notes (Signed)
Pt presents from work with epigastric pain, headache, and sob; burning sensation in chest; some nausea no vomiting; some dizziness, no syncope;

## 2018-07-14 NOTE — ED Provider Notes (Signed)
[facility] EMERGENCY DEPARTMENT Provider Note   CSN: [id number] Arrival date & time: 07/14/18  1835     History   Chief Complaint Chief Complaint  Patient presents with  . Abdominal Pain  . Headache  . Shortness of Breath    HPI Colin Butler is a 32 y.o. male.   32 year old male with history of esophageal reflux presents to the emergency department for evaluation of epigastric and right upper quadrant abdominal pain.  He describes the pain as burning.  It is waxing and waning as well as intermittent.  States that pain is slightly aggravated with eating.  He has had similar symptoms in the past which improved following Zantac and Prilosec.  Notes that he ran out of this medication recently.  He has not had any fevers, vomiting, bowel changes, dysuria.  No history of abdominal surgeries.  Has not seen a gastroenterologist, but is followed by a primary care doctor.     History reviewed. No pertinent past medical history.  Patient Active Problem List   Diagnosis Date Noted  . Language barrier affecting health care 05/21/2018  . GERD (gastroesophageal reflux disease) 04/03/2018  . Allergic conjunctivitis 03/10/2018  . Upper back pain, chronic 03/02/2018  . Sore throat 03/02/2018  . Pain of both eyes 03/02/2018    History reviewed. No pertinent surgical history.      Home Medications    Prior to Admission medications   Medication Sig Start Date End Date Taking? Authorizing Provider  Baclofen 5 MG TABS Take 5 mg by mouth 3 (three) times daily as needed. Patient not taking: Reported on 05/07/2018 04/03/18   Oralia Manis, DO  esomeprazole (NEXIUM) 20 MG capsule Take 1 capsule (20 mg total) by mouth daily for 14 days. 04/01/18 04/15/18  Cathie Hoops, Amy V, PA-C  naproxen (NAPROSYN) 500 MG tablet Take 1 tablet (500 mg total) by mouth 2 (two) times daily. 05/07/18   Dahlia Byes A, NP  omeprazole (PRILOSEC) 20 MG capsule Take 1 capsule (20 mg total) by mouth daily.  07/14/18   Antony Madura, PA-C  ranitidine (ZANTAC) 150 MG capsule Take 1 capsule (150 mg total) by mouth daily. 07/14/18   Antony Madura, PA-C    Family History Family History  Family history unknown: Yes    Social History Social History   Tobacco Use  . Smoking status: Never Smoker  . Smokeless tobacco: Never Used  Substance Use Topics  . Alcohol use: No    Frequency: Never  . Drug use: Never     Allergies   Patient has no known allergies.   Review of Systems Review of Systems Ten systems reviewed and are negative for acute change, except as noted in the HPI.    Physical Exam Updated Vital Signs BP (!) 125/91 (BP Location: Right Arm)   Pulse 72   Temp 98.2 F (36.8 C) (Oral)   Resp 16   SpO2 99%   Physical Exam  Constitutional: He is oriented to person, place, and time. He appears well-developed and well-nourished. No distress.  Nontoxic appearing and in NAD  HENT:  Head: Normocephalic and atraumatic.  Eyes: Conjunctivae and EOM are normal. No scleral icterus.  Neck: Normal range of motion.  Cardiovascular: Normal rate, regular rhythm and intact distal pulses.  Pulmonary/Chest: Effort normal. No stridor. No respiratory distress.  Respirations even and unlabored  Abdominal: He exhibits no mass. There is tenderness (mild). There is no guarding.  Mild TTP in the epigastrium and BUQ. No masses  or peritoneal signs.  Musculoskeletal: Normal range of motion.  Neurological: He is alert and oriented to person, place, and time. He exhibits normal muscle tone. Coordination normal.  Skin: Skin is warm and dry. No rash noted. He is not diaphoretic. No erythema. No pallor.  Psychiatric: He has a normal mood and affect. His behavior is normal.  Nursing note and vitals reviewed.    ED Treatments / Results  Labs (all labs ordered are listed, but only abnormal results are displayed) Labs Reviewed  COMPREHENSIVE METABOLIC PANEL - Abnormal; Notable for the following  components:      Result Value   Glucose, Bld 122 (*)    Total Protein 8.4 (*)    All other components within normal limits  LIPASE, BLOOD  CBC    EKG None  Radiology US Abdomen Limited  Result Date: 07/14/2018 CLINICAL DATA:  Right upper quadrant pain EXAM: ULTRASOUND ABDOMEN LIMITED RIGHT UPPER QUADRANT COMPARISON:  Ultrasound 05/25/2018 FINDINGS: Gallbladder: No gallstones or wall thickening visualized. No sonographic Murphy sign noted by sonographer. Common bile duct: Diameter: 2.4 mm Liver: Increased hepatic echogenicity. No focal hepatic abnormality. Portal vein is patent on color Doppler imaging with normal direction of blood flow towards the liver. IMPRESSION: 1. Negative for gallstones or biliary dilatation 2. Increased hepatic echogenicity suggesting steatosis Electronically Signed   By: Jasmine Pang M.D.   On: 07/14/2018 20:00    Procedures Procedures (including critical care time)  Medications Ordered in ED Medications  hyoscyamine (LEVSIN SL) SL tablet 0.25 mg (0.25 mg Sublingual Given 07/14/18 1856)  alum & mag hydroxide-simeth (MAALOX/MYLANTA) 200-200-20 MG/5ML suspension 30 mL (30 mLs Oral Given 07/14/18 1855)    And  lidocaine (XYLOCAINE) 2 % viscous mouth solution 15 mL (15 mLs Oral Given 07/14/18 1855)  promethazine (PHENERGAN) injection 25 mg (25 mg Intramuscular Given 07/14/18 2005)     Initial Impression / Assessment and Plan / ED Course  I have reviewed the triage vital signs and the nursing notes.  Pertinent labs & imaging results that were available during my care of the patient were reviewed by me and considered in my medical decision making (see chart for details).     32 year old male presents to the emergency department for burning epigastric and right upper quadrant abdominal pain.  He has had similar symptoms associated with gastritis.  Reports resolution of his discomfort with Zantac and Prilosec which he ran out of a few days ago.  I do believe that  his symptoms are secondary to reflux exacerbation.  He has no signs of acute surgical abdomen on exam.  The patient is afebrile with reassuring vital signs.  His laboratory work-up is noncontributory; no leukocytosis, electrolyte derangements, abnormalities to liver or kidney function.  Lipase is normal.  An ultrasound of the abdomen was ordered in triage which is negative for gallstones.  Mild evidence of hepatic steatosis.  Plan for referral to gastroenterology given chronicity of symptoms.  Return precautions discussed and provided. Patient discharged in stable condition with no unaddressed concerns.   Final Clinical Impressions(s) / ED Diagnoses   Final diagnoses:  Gastroesophageal reflux disease, esophagitis presence not specified    ED Discharge Orders         Ordered    omeprazole (PRILOSEC) 20 MG capsule  Daily     07/14/18 2253    ranitidine (ZANTAC) 150 MG capsule  Daily     07/14/18 2253           Antony Madura, PA-C 07/14/18  1610    Jacalyn Lefevre, MD 07/14/18 2324

## 2018-07-14 NOTE — ED Provider Notes (Signed)
Patient placed in Quick Look pathway, seen and evaluated   Chief Complaint: abdominal pain  HPI: Colin Butler is a 32 y.o. male who presents to the ED with abdominal pain.the pain is located in the epigastric and RUQ. Patient describes the pain as burning. Patient reports having same symptoms and going to his PCP on more than one occasion. He was given medication for acid in his stomach and it helped but he ran out.   ROS: GI: abdominal pain, n/v  Physical Exam:  BP (!) 171/103 (BP Location: Right Arm)   Pulse 85   Temp (!) 97.5 F (36.4 C) (Oral)   Resp 18   SpO2 100%    Gen: No distress  Neuro: Awake and Alert  Skin: Warm and dry  Abdomen: soft, tender to palpation epigastric and RUQ  Heart: regular rate and rhythm  Lungs: clear   Initiation of care has begun. The patient has been counseled on the process, plan, and necessity for staying for the completion/evaluation, and the remainder of the medical screening examination    Janne Napoleon, NP 07/14/18 1851    Jacalyn Lefevre, MD 07/14/18 2018

## 2018-07-14 NOTE — Discharge Instructions (Signed)
Continue Zantac and Prilosec for symptom management. Follow up with a GI specialist for further evaluation of ongoing symptoms. Call in the morning to schedule this appointment. You may return for new or concerning symptoms.

## 2018-07-14 NOTE — ED Notes (Signed)
ED Provider at bedside. 

## 2018-07-31 ENCOUNTER — Other Ambulatory Visit: Payer: Self-pay

## 2018-07-31 ENCOUNTER — Ambulatory Visit: Payer: BLUE CROSS/BLUE SHIELD

## 2018-07-31 ENCOUNTER — Ambulatory Visit: Payer: BLUE CROSS/BLUE SHIELD | Admitting: Family Medicine

## 2018-07-31 VITALS — BP 110/70 | HR 72 | Temp 98.2°F | Ht 65.0 in | Wt 153.0 lb

## 2018-07-31 DIAGNOSIS — Z23 Encounter for immunization: Secondary | ICD-10-CM

## 2018-07-31 DIAGNOSIS — K219 Gastro-esophageal reflux disease without esophagitis: Secondary | ICD-10-CM

## 2018-07-31 DIAGNOSIS — G44221 Chronic tension-type headache, intractable: Secondary | ICD-10-CM

## 2018-07-31 DIAGNOSIS — R51 Headache: Secondary | ICD-10-CM

## 2018-07-31 DIAGNOSIS — R519 Headache, unspecified: Secondary | ICD-10-CM | POA: Insufficient documentation

## 2018-07-31 NOTE — Progress Notes (Signed)
Subjective:    Patient ID: Colin Butler, male    DOB: 1986/06/12, 32 y.o.   MRN: [id number]   CC: abdominal pain and headache  HPI: History obtained using Ipad interpretor Abdominal pain Patient presenting today for symptoms of epigastric pain.  Patient states that he has had epigastric abdominal pain for 1 week.  States that this is very similar to abdominal pain that he has had in the past. Seen by Dr. Shawnie Pons on 05/21/18. Patient at that time was treated with PPI and H2 blockers as well as treated for H. Pylori. RUQ [place] was negative for gallstones/biliary dilitation. Steatosis was suggested on [place]. Recent CMP on 07/14/18 wnl. Lipase wnl on 07/14/18. Was referred to GI at that time.  Patient states that when he takes his medications, Prilosec and Zantac, he feels better but in the evenings pain is worse.  Patient was unaware of where to go for GI so he did not follow-up.  Patient was told that he needs an endoscopy but was not sure how to contact GI doctor due to language barrier.  Did not report any nausea, vomiting, diarrhea.  Headache Patient reports chronic headache for 5 to 6 years.  States they are continuous and occur every day.  Does not report any worsening.  States they are in the frontal area of his head bilaterally.  States that he has seen previous PCP and has been to the hospital for these headaches and was told that his CT scan showed that he would have headaches forever.  Does not use any medications for headaches other than Tylenol once a day.  States Tylenol helps a headache but after medication wears off headaches return.  Is able to sleep well without waking up for worsening pain.  Reports decreased water intake when at home.  Drinks 2 to 3 L of water when at work.  Does not report significant vision changes, but does states sometimes his vision gets somewhat blurry.   Objective:  BP 110/70   Pulse 72   Temp 98.2 F (36.8 C) (Oral)   Ht 5\' 5"  (1.651 m)   Wt 153 lb (69.4 kg)    SpO2 99%   BMI 25.46 kg/m  Vitals and nursing note reviewed  General: well nourished, in no acute distress HEENT: normocephalic, PERRL, fundoscopic exam normal, no scleral icterus or conjunctival pallor, no nasal discharge, moist mucous membranes  Neck: supple, full ROM Cardiac: RRR, clear S1 and S2, no murmurs, rubs, or gallops Respiratory: clear to auscultation bilaterally, no increased work of breathing Abdomen: soft, diffuse tenderness, nondistended, no masses or organomegaly. Bowel sounds present Extremities: no edema or cyanosis. Warm, well perfused.  5/5 muscle strength bilaterally with normal grip strength  Skin: warm and dry, no rashes noted Neuro: alert and oriented, no focal deficits, CN2-12 intact   Assessment & Plan:    GERD (gastroesophageal reflux disease) Continue PPI and H2 blocker as patient seems to have relief with medications. Recent lab work and [place] are wnl. Vitals wnl. Abdominal exam with diffuse tenderness but otherwise benign. Have placed ambulatory referral to GI as patient will likely need endoscopy given chronicity of issue and symptoms despite PPI treatment. Can consider eosinophilic esophagitis vs. Gastritis as differential.  -follow up with GI, have placed comments to send letter or leave VM as patient does not speak English but has a nephew that can interpret  -continue PPI and H2 blocker -follow up in 1 month if no improvement. Return precautions given  Headache  Patient with chronic headaches for 5 to 6 years.  No worsening of symptoms and no deficit on exam so no imaging is required at this time.  Patient's headaches are improved with Tylenol use, however patient only uses Tylenol once a day.  Advised patient that he may use Tylenol twice a day to see if this improves.  Also advised patient use conservative measures such as increasing water intake and resting.  Patient symptoms can also be related to possible atypical migraine.  If patient's symptoms are not  resolved with conservative measures can consider preventative treatment for migraine headaches at follow-up visit.  Strict return precautions given.  No red flags symptoms at this time.  Advised patient to follow-up in 2 weeks if no improvement.   Discussed patient with Dr. Deirdre Priesthambliss  Return in about 2 weeks (around 08/14/2018), or if symptoms worsen or fail to improve.   Oralia ManisSherin Endiya Klahr, DO, PGY-2

## 2018-07-31 NOTE — Patient Instructions (Signed)
It was a pleasure seeing you today.   Today we discussed your abdominal pain and headache  For your abdominal pain: I have referred you to a GI doctor. They will either call or send a letter with results.   For your headaches: please continue to take tylenol, you can increase to twice a day. Please increase your water intake. If your headaches don't improve in 2 weeks come back in and see your primary care doctor.   Please follow up in 2 weeks if no improvement or sooner if symptoms persist or worsen. Please call the clinic immediately if you have any concerns.   Our clinic's number is [phone]. Please call with questions or concerns.   Please go to the emergency room if you have worsening headache, vision changes, dizziness.   Thank you,  Oralia ManisSherin Peretz Thieme, DO

## 2018-07-31 NOTE — Assessment & Plan Note (Signed)
Continue PPI and H2 blocker as patient seems to have relief with medications. Recent lab work and US are wnl. Vitals wnl. Abdominal exam with diffuse tenderness but otherwise benign. Have placed ambulatory referral to GI as patient will likely need endoscopy given chronicity of issue and symptoms despite PPI treatment. Can consider eosinophilic esophagitis vs. Gastritis as differential.  -follow up with GI, have placed comments to send letter or leave VM as patient does not speak English but has a nephew that can interpret  -continue PPI and H2 blocker -follow up in 1 month if no improvement. Return precautions given

## 2018-07-31 NOTE — Assessment & Plan Note (Signed)
Patient with chronic headaches for 5 to 6 years.  No worsening of symptoms and no deficit on exam so no imaging is required at this time.  Patient's headaches are improved with Tylenol use, however patient only uses Tylenol once a day.  Advised patient that he may use Tylenol twice a day to see if this improves.  Also advised patient use conservative measures such as increasing water intake and resting.  Patient symptoms can also be related to possible atypical migraine.  If patient's symptoms are not resolved with conservative measures can consider preventative treatment for migraine headaches at follow-up visit.  Strict return precautions given.  No red flags symptoms at this time.  Advised patient to follow-up in 2 weeks if no improvement.

## 2018-09-24 ENCOUNTER — Ambulatory Visit: Payer: BLUE CROSS/BLUE SHIELD | Admitting: Gastroenterology

## 2018-09-24 NOTE — Progress Notes (Deleted)
[facility] Consult Note:  History: Colin Butler 09/24/2018  Referring physician: Lovena Neighboursiallo, Abdoulaye, MD  Reason for consult/chief complaint: No chief complaint on file.   Subjective  HPI:  *** He has had a few visits to urgent care and emergency department over the last 6 months for gastric pain and heartburn.  He had been taking Zantac, has been prescribed PPI and on one occasion hyoscyamine.  ROS:  Review of Systems   Past Medical History: No past medical history on file.   Past Surgical History: No past surgical history on file.   Family History: Family History  Family history unknown: Yes    Social History: Social History   Socioeconomic History  . Marital status: Married    Spouse name: Not on file  . Number of children: Not on file  . Years of education: Not on file  . Highest education level: Not on file  Occupational History  . Not on file  Social Needs  . Financial resource strain: Not on file  . Food insecurity:    Worry: Not on file    Inability: Not on file  . Transportation needs:    Medical: Not on file    Non-medical: Not on file  Tobacco Use  . Smoking status: Current Some Day Smoker    Types: Cigarettes  . Smokeless tobacco: Never Used  Substance and Sexual Activity  . Alcohol use: No    Frequency: Never  . Drug use: Never  . Sexual activity: Not on file  Lifestyle  . Physical activity:    Days per week: Not on file    Minutes per session: Not on file  . Stress: Not on file  Relationships  . Social connections:    Talks on phone: Not on file    Gets together: Not on file    Attends religious service: Not on file    Active member of club or organization: Not on file    Attends meetings of clubs or organizations: Not on file    Relationship status: Not on file  Other Topics Concern  . Not on file  Social History Narrative  . Not on file    Allergies: No Known Allergies  Outpatient  Meds: Current Outpatient Medications  Medication Sig Dispense Refill  . Baclofen 5 MG TABS Take 5 mg by mouth 3 (three) times daily as needed. (Patient not taking: Reported on 05/07/2018) 30 tablet 0  . esomeprazole (NEXIUM) 20 MG capsule Take 1 capsule (20 mg total) by mouth daily for 14 days. 14 capsule 0  . omeprazole (PRILOSEC) 20 MG capsule Take 1 capsule (20 mg total) by mouth daily. 30 capsule 1  . ranitidine (ZANTAC) 150 MG capsule Take 1 capsule (150 mg total) by mouth daily. 30 capsule 0   No current facility-administered medications for this visit.       ___________________________________________________________________ Objective   Exam:  There were no vitals taken for this visit.   General: this is a(n) ***   Eyes: sclera anicteric, no redness  ENT: oral mucosa moist without lesions, no cervical or supraclavicular lymphadenopathy  CV: RRR without murmur, S1/S2, no JVD, no peripheral edema  Resp: clear to auscultation bilaterally, normal RR and effort noted  GI: soft, *** tenderness, with active bowel sounds. No guarding or palpable organomegaly noted.  Skin; warm and dry, no rash or jaundice noted  Neuro: awake, alert and oriented x 3. Normal gross motor function and fluent speech  Labs:  CMP  Latest Ref Rng & Units 07/14/2018 05/21/2018 10/21/2017  Glucose 70 - 99 mg/dL 808(U) 110(R) 82  BUN 6 - 20 mg/dL 6 8 6   Creatinine 0.61 - 1.24 mg/dL 1.59 4.58 5.92  Sodium 135 - 145 mmol/L 137 138 144  Potassium 3.5 - 5.1 mmol/L 3.6 4.0 4.2  Chloride 98 - 111 mmol/L 100 99 104  CO2 22 - 32 mmol/L 26 25 24   Calcium 8.9 - 10.3 mg/dL 9.8 9.6 9.8  Total Protein 6.5 - 8.1 g/dL 9.2(K) 7.5 8.2  Total Bilirubin 0.3 - 1.2 mg/dL 0.5 0.4 0.5  Alkaline Phos 38 - 126 U/L 90 87 84  AST 15 - 41 U/L 29 17 26   ALT 0 - 44 U/L 37 22 38   CBC Latest Ref Rng & Units 07/14/2018 10/21/2017  WBC 4.0 - 10.5 K/uL 10.0 9.9  Hemoglobin 13.0 - 17.0 g/dL 46.2 86.3  Hematocrit 81.7 - 52.0 %  45.3 42.1  Platelets 150 - 400 K/uL 266 279     Radiologic Studies:  Abdominal ultrasounds in both September and November 2019, both showed fatty liver and no gallstones.  Assessment: No diagnosis found.  ***  Plan:  ***  Thank you for the courtesy of this consult.  Please call me with any questions or concerns.  Charlie Pitter III  CC: Referring provider noted above

## 2018-10-16 ENCOUNTER — Ambulatory Visit: Payer: Self-pay | Admitting: Gastroenterology

## 2018-10-22 ENCOUNTER — Encounter (HOSPITAL_COMMUNITY): Payer: Self-pay

## 2018-10-22 ENCOUNTER — Ambulatory Visit (HOSPITAL_COMMUNITY)
Admission: EM | Admit: 2018-10-22 | Discharge: 2018-10-22 | Disposition: A | Discharge status: Home / Self Care | Payer: Self-pay | Attending: Family Medicine | Admitting: Family Medicine

## 2018-10-22 DIAGNOSIS — M25511 Pain in right shoulder: Secondary | ICD-10-CM

## 2018-10-22 DIAGNOSIS — R1013 Epigastric pain: Secondary | ICD-10-CM

## 2018-10-22 DIAGNOSIS — R1011 Right upper quadrant pain: Secondary | ICD-10-CM

## 2018-10-22 LAB — COMPREHENSIVE METABOLIC PANEL
ALT: 31 U/L (ref 0–44)
AST: 26 U/L (ref 15–41)
Albumin: 4.7 g/dL (ref 3.5–5.0)
Alkaline Phosphatase: 73 U/L (ref 38–126)
Anion gap: 5 (ref 5–15)
BUN: 6 mg/dL (ref 6–20)
CO2: 29 mmol/L (ref 22–32)
Calcium: 9.7 mg/dL (ref 8.9–10.3)
Chloride: 105 mmol/L (ref 98–111)
Creatinine, Ser: 0.85 mg/dL (ref 0.61–1.24)
GFR calc Af Amer: >60 mL/min (ref >60)
GFR calc non Af Amer: >60 mL/min (ref >60)
Glucose, Bld: 89 mg/dL (ref 70–99)
Potassium: 4.1 mmol/L (ref 3.5–5.1)
Sodium: 139 mmol/L (ref 135–145)
Total Bilirubin: 0.2 mg/dL — ABNORMAL LOW (ref 0.3–1.2)
Total Protein: 8.6 g/dL — ABNORMAL HIGH (ref 6.5–8.1)

## 2018-10-22 LAB — CBC WITH DIFFERENTIAL/PLATELET
Abs Immature Granulocytes: 0.04 10*3/uL (ref 0.00–0.07)
Basophils Absolute: 0 10*3/uL (ref 0.0–0.1)
Basophils Relative: 0 %
Eosinophils Absolute: 0 10*3/uL (ref 0.0–0.5)
Eosinophils Relative: 0 %
HCT: 46.5 % (ref 39.0–52.0)
Hemoglobin: 15.5 g/dL (ref 13.0–17.0)
Immature Granulocytes: 0 %
Lymphocytes Relative: 18 %
Lymphs Abs: 2.1 10*3/uL (ref 0.7–4.0)
MCH: 27.5 pg (ref 26.0–34.0)
MCHC: 33.3 g/dL (ref 30.0–36.0)
MCV: 82.4 fL (ref 80.0–100.0)
Monocytes Absolute: 0.5 10*3/uL (ref 0.1–1.0)
Monocytes Relative: 4 %
Neutro Abs: 8.6 10*3/uL — ABNORMAL HIGH (ref 1.7–7.7)
Neutrophils Relative %: 78 %
Platelets: 274 10*3/uL (ref 150–400)
RBC: 5.64 MIL/uL (ref 4.22–5.81)
RDW: 11.7 % (ref 11.5–15.5)
WBC: 11.2 10*3/uL — ABNORMAL HIGH (ref 4.0–10.5)
nRBC: 0 % (ref 0.0–0.2)

## 2018-10-22 MED ORDER — HYOSCYAMINE SULFATE 0.125 MG SL SUBL: 0.2500 mg | SUBLINGUAL_TABLET | Freq: Once | SUBLINGUAL | Status: DC

## 2018-10-22 MED ORDER — LIDOCAINE VISCOUS HCL 2 % MT SOLN
15.0000 mL | Freq: Once | OROMUCOSAL | Status: AC
  Administered 2018-10-22: 15 mL via ORAL

## 2018-10-22 MED ORDER — ALUM & MAG HYDROXIDE-SIMETH 200-200-20 MG/5ML PO SUSP
30.0000 mL | Freq: Once | ORAL | Status: AC
  Administered 2018-10-22: 30 mL via ORAL

## 2018-10-22 MED ORDER — LIDOCAINE VISCOUS HCL 2 % MT SOLN
OROMUCOSAL | Status: AC
  Filled 2018-10-22: qty 15

## 2018-10-22 MED ORDER — HYOSCYAMINE SULFATE SL 0.125 MG SL SUBL: 1.0000 | SUBLINGUAL_TABLET | Freq: Two times a day (BID) | SUBLINGUAL | 1 refills | Status: AC

## 2018-10-22 MED ORDER — ALUM & MAG HYDROXIDE-SIMETH 200-200-20 MG/5ML PO SUSP
ORAL | Status: AC
  Filled 2018-10-22: qty 30

## 2018-10-22 NOTE — Discharge Instructions (Signed)
Continue Omprezole  Follow up with your doctors

## 2018-10-22 NOTE — ED Triage Notes (Signed)
Pt present chest pain,pt states that with the chest pain he has been dizziness, and back pain.  symptoms started Today.

## 2018-10-22 NOTE — ED Provider Notes (Signed)
[facility]    CSN: [id number] Arrival date & time: 10/22/18  1251     History   Chief Complaint Chief Complaint  Patient presents with  . Chest Pain    HPI Colin Butler is a 33 y.o. male.   Pt present chest pain,pt states that with the chest pain he has been dizziness, and back pain.  symptoms started Today.  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  Location:  Center of chest, epigastric, right upper quadrant Made better by:  rest Made worse by:  Activity and breathing Positional:   No change Recurrent problem:  Headache and epigastric pain are ongoing Past work up:  (sched for endoscopy 28 Feb) Associated sx:  No cough, but does have shortness of breath, no calf pain Radiation: to right shoulder  Recent travel:  negative  ROS:  No fever, chills, nausea, vomiting, Risk factors:  Positive forSmoking 2-3 cigs per week, no, F/H of early heart disease (before age 39), hypertension, diabetes, clots     History reviewed. No pertinent past medical history.  Patient Active Problem List   Diagnosis Date Noted  . Headache 07/31/2018  . Language barrier affecting health care 05/21/2018  . GERD (gastroesophageal reflux disease) 04/03/2018  . Allergic conjunctivitis 03/10/2018  . Upper back pain, chronic 03/02/2018  . Sore throat 03/02/2018  . Pain of both eyes 03/02/2018    History reviewed. No pertinent surgical history.     Home Medications    Prior to Admission medications   Medication Sig Start Date End Date Taking? Authorizing Provider  Hyoscyamine Sulfate SL (LEVSIN/SL) 0.125 MG SUBL Place 1 tablet under the tongue 2 (two) times daily. 10/22/18   Elvina Sidle, MD  omeprazole (PRILOSEC) 20 MG capsule Take 1 capsule (20 mg total) by mouth daily. 07/14/18   Antony Madura, PA-C    Family  History Family History  Family history unknown: Yes    Social History Social History   Tobacco Use  . Smoking status: Current Some Day Smoker    Types: Cigarettes  . Smokeless tobacco: Never Used  Substance Use Topics  . Alcohol use: No    Frequency: Never  . Drug use: Never     Allergies   Patient has no known allergies.   Review of Systems Review of Systems  Constitutional: Negative for chills and fever.  Respiratory: Positive for chest tightness and shortness of breath.   Cardiovascular: Positive for chest pain.  Gastrointestinal: Positive for abdominal pain. Negative for vomiting.     Physical Exam Triage Vital Signs ED Triage Vitals [10/22/18 1316]  Enc Vitals Group     BP 131/84     Pulse Rate 80     Resp 16     Temp 98 F (36.7 C)     Temp Source Oral     SpO2 100 %     Weight      Height      Head Circumference      Peak Flow      Pain Score 9     Pain Loc      Pain Edu?      Excl. in GC?    No data found.  Updated Vital Signs BP 131/84 (BP Location: Right Arm)   Pulse 80   Temp 98 F (36.7 C) (Oral)   Resp  16   SpO2 100%    Physical Exam Vitals signs and nursing note reviewed.  Constitutional:      General: He is in acute distress.     Appearance: He is well-developed.  HENT:     Head: Normocephalic.  Eyes:     Extraocular Movements: Extraocular movements intact.  Neck:     Musculoskeletal: Normal range of motion and neck supple.  Cardiovascular:     Rate and Rhythm: Normal rate and regular rhythm.     Heart sounds: Normal heart sounds.  Pulmonary:     Effort: Pulmonary effort is normal.     Breath sounds: Normal breath sounds.     Comments: Tender right chest to palpation generalized Abdominal:     General: Bowel sounds are normal.     Palpations: Abdomen is soft. There is no mass.     Tenderness: There is abdominal tenderness. There is rebound.     Comments: RUQ and epigastric tenderness with rebound  Musculoskeletal:      Comments: Calves unremarkable  Skin:    General: Skin is warm and dry.     Capillary Refill: Capillary refill takes less than 2 seconds.  Neurological:     General: No focal deficit present.     Mental Status: He is alert.   Given GI cocktail and epigastric pain is better but he still does have some right upper quadrant pain.   UC Treatments / Results  Labs (all labs ordered are listed, but only abnormal results are displayed) Labs last fall:  CBC, H.Pylori, urine, CMET show no abnormalities except a positive H.Pylori IgG  EKG  12 lead EKG today shows no acute changes  Radiology COMPARISON:  Ultrasound 05/25/2018  FINDINGS: Gallbladder:  No gallstones or wall thickening visualized. No sonographic Murphy sign noted by sonographer.  Common bile duct:  Diameter: 2.4 mm  Liver:  Increased hepatic echogenicity. No focal hepatic abnormality. Portal vein is patent on color Doppler imaging with normal direction of blood flow towards the liver.  IMPRESSION: 1. Negative for gallstones or biliary dilatation 2. Increased hepatic echogenicity suggesting steatosis   Electronically Signed   By: Jasmine Pang M.D.   On: 07/14/2018 20:00 Procedures Procedures (including critical care time)  Medications Ordered in UC Medications  alum & mag hydroxide-simeth (MAALOX/MYLANTA) 200-200-20 MG/5ML suspension 30 mL (30 mLs Oral Given 10/22/18 1457)    And  lidocaine (XYLOCAINE) 2 % viscous mouth solution 15 mL (15 mLs Oral Given 10/22/18 1457)    Initial Impression / Assessment and Plan / UC Course  I have reviewed the triage vital signs and the nursing notes.  Pertinent labs & imaging results that were available during my care of the patient were reviewed by me and considered in my medical decision making (see chart for details).    Final Clinical Impressions(s) / UC Diagnoses   Final diagnoses:  Epigastric pain  Abdominal pain, right upper quadrant  Acute pain of  right shoulder     Discharge Instructions     Continue Omprezole  Follow up with your doctors    ED Prescriptions    Medication Sig Dispense Auth. Provider   Hyoscyamine Sulfate SL (LEVSIN/SL) 0.125 MG SUBL Place 1 tablet under the tongue 2 (two) times daily. 20 each Elvina Sidle, MD     Controlled Substance Prescriptions [place] Controlled Substance Registry consulted? Not Applicable   Elvina Sidle, MD 10/22/18 [phone]

## 2018-11-06 ENCOUNTER — Ambulatory Visit (INDEPENDENT_AMBULATORY_CARE_PROVIDER_SITE_OTHER): Payer: Self-pay | Admitting: Gastroenterology

## 2018-11-06 ENCOUNTER — Encounter: Payer: Self-pay | Admitting: Gastroenterology

## 2018-11-06 VITALS — BP 118/74 | HR 86 | Ht 66.0 in | Wt 150.0 lb

## 2018-11-06 DIAGNOSIS — R1013 Epigastric pain: Secondary | ICD-10-CM

## 2018-11-06 DIAGNOSIS — A048 Other specified bacterial intestinal infections: Secondary | ICD-10-CM

## 2018-11-06 MED ORDER — OMEPRAZOLE 40 MG PO CPDR: 40.0000 mg | DELAYED_RELEASE_CAPSULE | Freq: Every day | ORAL | 3 refills | Status: AC

## 2018-11-06 NOTE — Progress Notes (Signed)
Referring Provider: Lovena Neighbours, MD Primary Care Physician:  Lovena Neighbours, MD   Reason for Consultation:  Abdominal pain   IMPRESSION:  Epigastric abdominal pain x 4-5 years H pylori positive 05/2018    - treated with PPI, erythromycin, and amoxicillin Echogenic liver on ultrasound 07/2018  Differential for his epigastric abdominal pain includes reflux, persistent H. pylori despite treatment, peptic ulcer disease, functional dyspepsia.  Given his symptoms despite treatment I am recommending an EGD with gastric biopsies.    PLAN: Increase omeprazole to 40 mg BID EGD with plans to biopsy for H. pylori Reviewed GERD dietary recommendations  I consented the patient at the bedside today discussing the risks, benefits, and alternatives to endoscopic evaluation. In particular, we discussed the risks that include, but are not limited to, reaction to medication, cardiopulmonary compromise, bleeding requiring blood transfusion, aspiration resulting in pneumonia, perforation requiring surgery, lack of diagnosis, severe illness requiring hospitalization, and even death. We reviewed the risk of missed lesion including polyps or even cancer. The patient acknowledges these risks and asks that we proceed.   HPI: Colin Butler is a 33 y.o. male [place]e seen in consultation at the request of Dr. Darin Engels for further evaluation of abdominal pain. History obtained with the ipad interpreter.  In the [place] for 8 years. Came as a refugee.  The history is obtained to the patient and review of his electronic health record.  Nonradiating, epigastric abdominal pain x 4-5 years.  Worse over the last few months.  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  Often involves the chest.  Not related to food, position, or defecation.  No dysphagia, odynophagia, nausea, vomiting, or change in bowel habits labs showed H pylori + 05/21/18.  Treated with Clairthro and Amoxcillin. Pain was better while on the antibiotics. On omeprazole without significant change in his evening symptoms symptoms.  No weight loss.  Normal appetite.  No other associated symptoms. No identified exacerbating or relieving features.   Seen by Dr. Darin Engels 07/31/2018 who told the patient he needed an endoscopy.  He was on an H2 blocker and PPI therapy at that time.  The patient was seen in the ED 10/22/2018 with escalating symptoms.  At that time he felt like it was chest pain with some associated dizziness.  Final diagnosis was epigastric pain.  He may have been been off his PPI and H2 blocker for 1 month.  He was told to continue his omeprazole and was prescribed hyoscyamine 0.125 mg twice daily.  Previously evaluated in [place].  He had an EGD there 5 years ago.  He is not aware of the results.  I have personally reviewed the following images:  RUQ ultrasound 05/25/18: echogenic liver. No other abnormalities.  Ultrasound 07/14/2018 showed an echogenic liver but was otherwise normal.  Labs 07/14/18: WBC 10, hgb 15, platelets 266, glucose 122  Father with history of similar symptoms and is treated with omeprazole. No known family history of GI disease. No NSAIDs.  Past Medical History:  Diagnosis Date  . GERD (gastroesophageal reflux disease)     History reviewed. No pertinent surgical history.  Current Outpatient Medications  Medication Sig Dispense Refill  . Hyoscyamine Sulfate SL (LEVSIN/SL) 0.125 MG SUBL Place 1 tablet under the tongue 2 (two) times daily. 20 each 1  . omeprazole (PRILOSEC) 40 MG capsule Take 1 capsule (40 mg total) by mouth daily. 90 capsule 3   No current facility-administered  medications for this visit.     Allergies as of 11/06/2018  . (No Known Allergies)    Family History  Family history unknown: Yes    Social History   Socioeconomic History  . Marital status: Married    Spouse name: Not on file  . Number of  children: Not on file  . Years of education: Not on file  . Highest education level: Not on file  Occupational History  . Not on file  Social Needs  . Financial resource strain: Not on file  . Food insecurity:    Worry: Not on file    Inability: Not on file  . Transportation needs:    Medical: Not on file    Non-medical: Not on file  Tobacco Use  . Smoking status: Current Some Day Smoker    Types: Cigarettes  . Smokeless tobacco: Never Used  Substance and Sexual Activity  . Alcohol use: Yes    Frequency: Never  . Drug use: Never  . Sexual activity: Not on file  Lifestyle  . Physical activity:    Days per week: Not on file    Minutes per session: Not on file  . Stress: Not on file  Relationships  . Social connections:    Talks on phone: Not on file    Gets together: Not on file    Attends religious service: Not on file    Active member of club or organization: Not on file    Attends meetings of clubs or organizations: Not on file    Relationship status: Not on file  . Intimate partner violence:    Fear of current or ex partner: Not on file    Emotionally abused: Not on file    Physically abused: Not on file    Forced sexual activity: Not on file  Other Topics Concern  . Not on file  Social History Narrative  . Not on file    Review of Systems: 12 system ROS is negative except as noted above.  Filed Weights   11/06/18 1421  Weight: 150 lb (68 kg)    Physical Exam: Vital signs were reviewed. General:   Alert, well-nourished, pleasant and cooperative in NAD Head:  Normocephalic and atraumatic. Eyes:  Sclera clear, no icterus.   Conjunctiva pink. Mouth:  No deformity or lesions.   Neck:  Supple; no thyromegaly. Lungs:  Clear throughout to auscultation.   No wheezes. Heart:  Regular rate and rhythm; no murmurs Abdomen:  Soft, central obesity, mild epigastric tenderness with deep palpation, normal bowel sounds. No rebound or guarding. No  hepatosplenomegaly Rectal:  Deferred  Msk:  Symmetrical without gross deformities. Extremities:  No gross deformities or edema. Neurologic:  Alert and  oriented x4;  grossly nonfocal Skin:  No rash or bruise. Psych:  Alert and cooperative. Normal mood and affect.   Breeona Waid L. Orvan Falconer, MD, MPH  Gastroenterology 11/19/2018, 1:12 PM

## 2018-11-06 NOTE — Patient Instructions (Signed)
If you are age 31 or older, your body mass index should be between 23-30. Your Body mass index is 24.21 kg/m. If this is out of the aforementioned range listed, please consider follow up with your Primary Care Provider.  If you are age 9 or younger, your body mass index should be between 19-25. Your Body mass index is 24.21 kg/m. If this is out of the aformentioned range listed, please consider follow up with your Primary Care Provider.   You have been scheduled for an endoscopy. Please follow written instructions given to you at your visit today. If you use inhalers (even only as needed), please bring them with you on the day of your procedure. Your physician has requested that you go to www.startemmi.com and enter the access code given to you at your visit today. This web site gives a general overview about your procedure. However, you should still follow specific instructions given to you by our office regarding your preparation for the procedure.  We have sent the following medications to your pharmacy for you to pick up at your convenience:  Omeprazole   Thank you for choosing me and [facility].  Dr.Kimberly Beavers

## 2018-11-19 ENCOUNTER — Encounter: Payer: Self-pay | Admitting: Gastroenterology

## 2018-11-19 ENCOUNTER — Telehealth: Payer: Self-pay | Admitting: Gastroenterology

## 2018-11-20 NOTE — Telephone Encounter (Signed)
Dr. Orvan Falconer,  Do you wish for this patient to be charged?

## 2018-11-20 NOTE — Telephone Encounter (Signed)
No charge. Thanks.  

## 2019-08-31 ENCOUNTER — Other Ambulatory Visit: Payer: Self-pay

## 2019-09-02 ENCOUNTER — Ambulatory Visit: Payer: HRSA Program | Attending: Internal Medicine

## 2019-09-02 DIAGNOSIS — Z20828 Contact with and (suspected) exposure to other viral communicable diseases: Secondary | ICD-10-CM | POA: Diagnosis not present

## 2019-09-02 DIAGNOSIS — Z20822 Contact with and (suspected) exposure to covid-19: Secondary | ICD-10-CM

## 2019-09-04 LAB — NOVEL CORONAVIRUS, NAA: SARS-CoV-2, NAA: NOT DETECTED

## 2019-09-16 ENCOUNTER — Telehealth: Payer: Self-pay | Admitting: Family Medicine

## 2019-09-16 NOTE — Telephone Encounter (Signed)
Pt and pt's friend called in stating they are needing a copy of lab results of covid-19 mailed to show employer. Please advise.

## 2020-01-02 IMAGING — US US ABDOMEN LIMITED
1 series · 14 of 25 positions shown · non-contrast
Comparison: Ultrasound 05/25/2018

CLINICAL DATA: Right upper quadrant pain

EXAM:
ULTRASOUND ABDOMEN LIMITED RIGHT UPPER QUADRANT

[Series 1: us abdomen limited · 0.19mm/px · 14 of 39 slices shown]
[im 1/39]
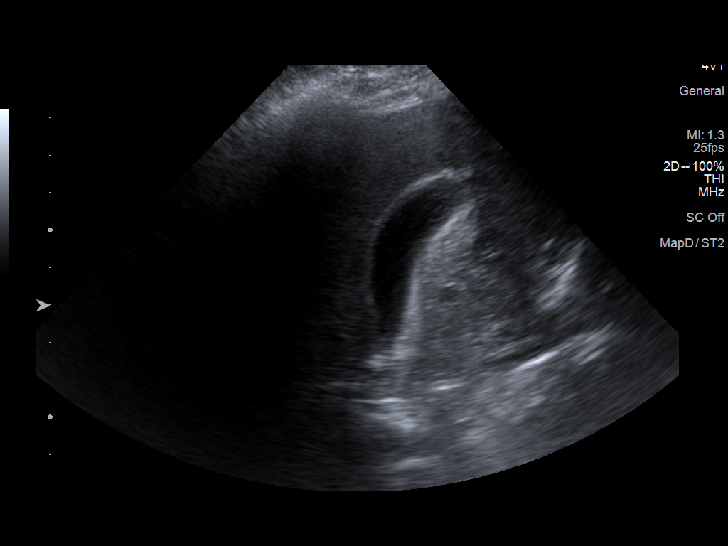
[im 4/39]
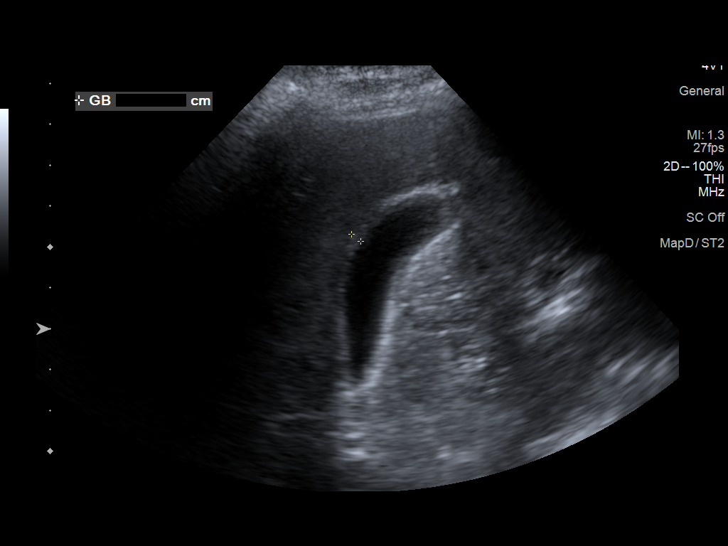
[im 7/39]
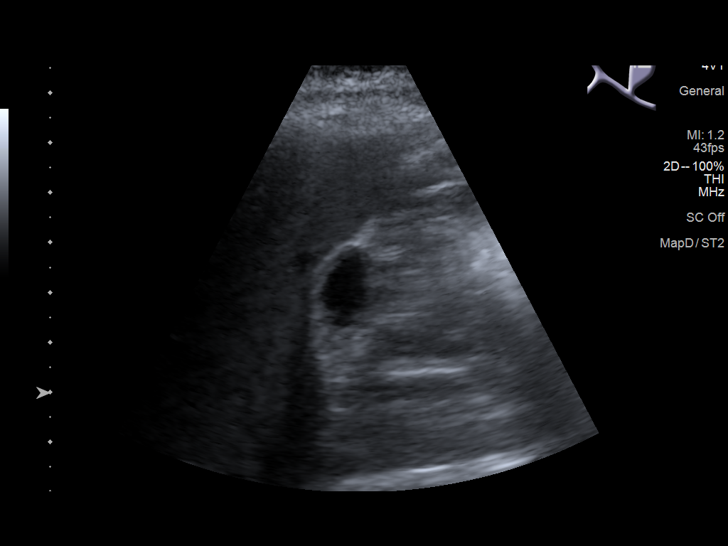
[im 10/39]
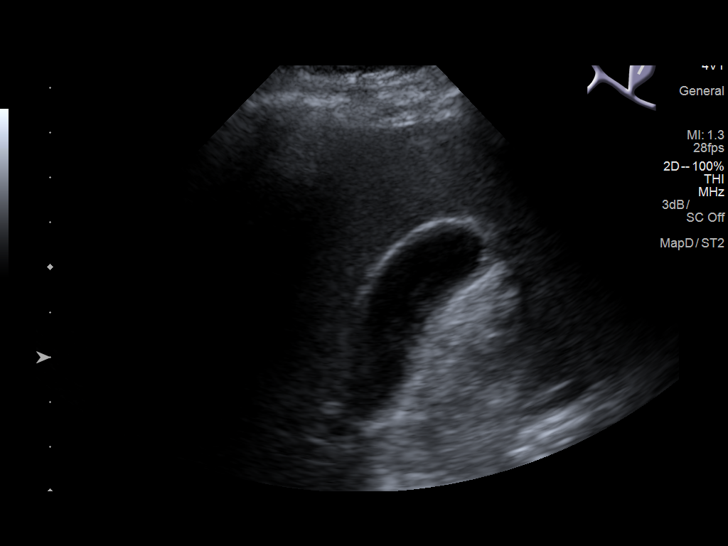
[im 13/39]
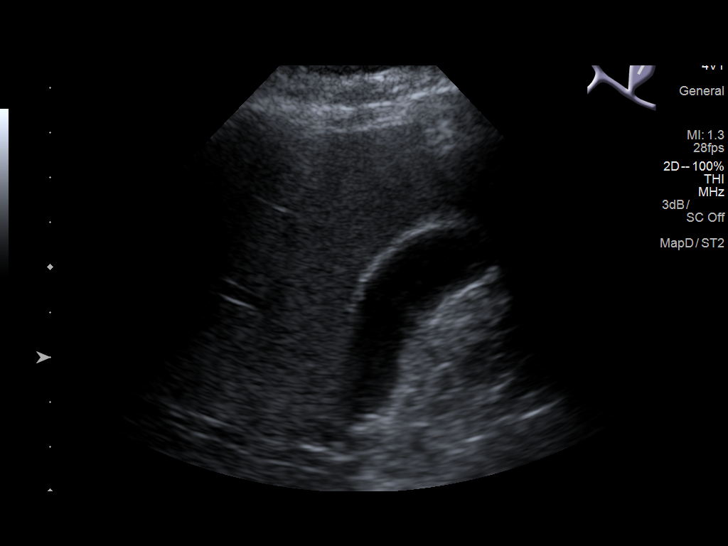
[im 15/39]
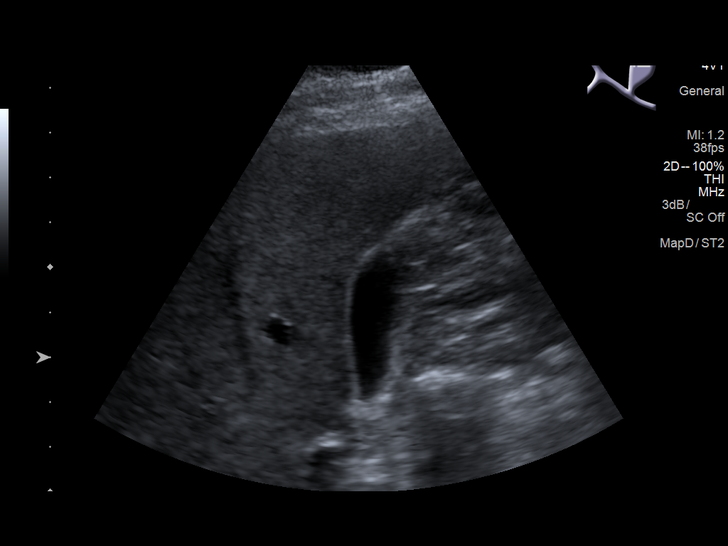
[im 18/39]
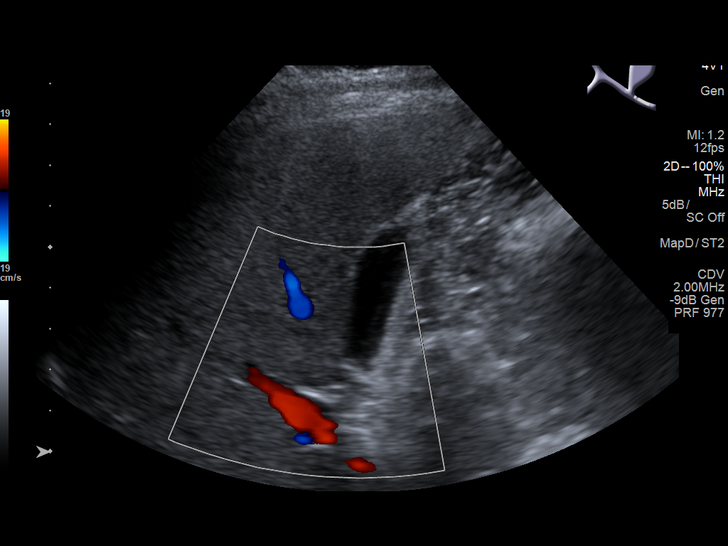
[im 21/39]
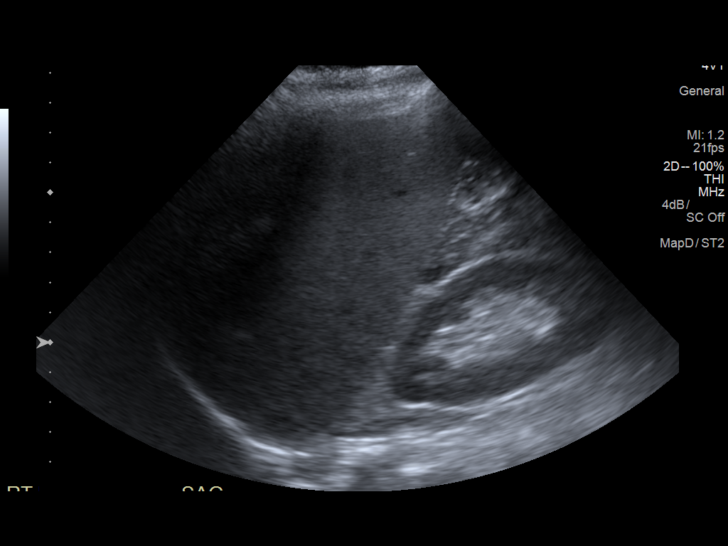
[im 24/39]
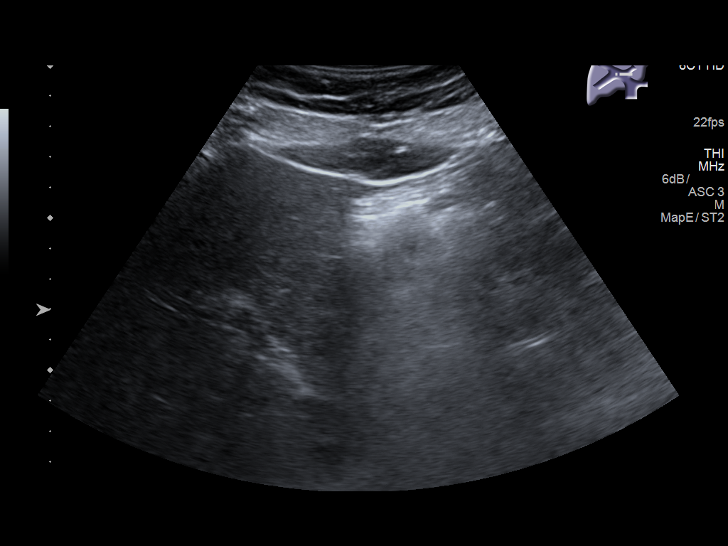
[im 26/39]
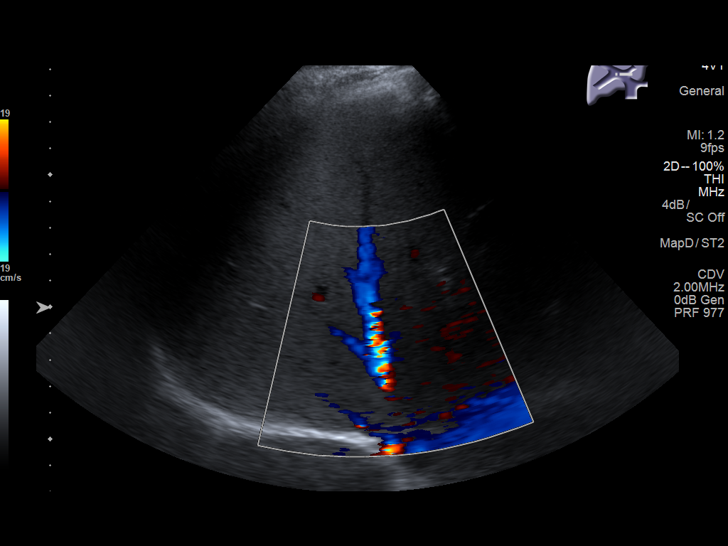
[im 29/39]
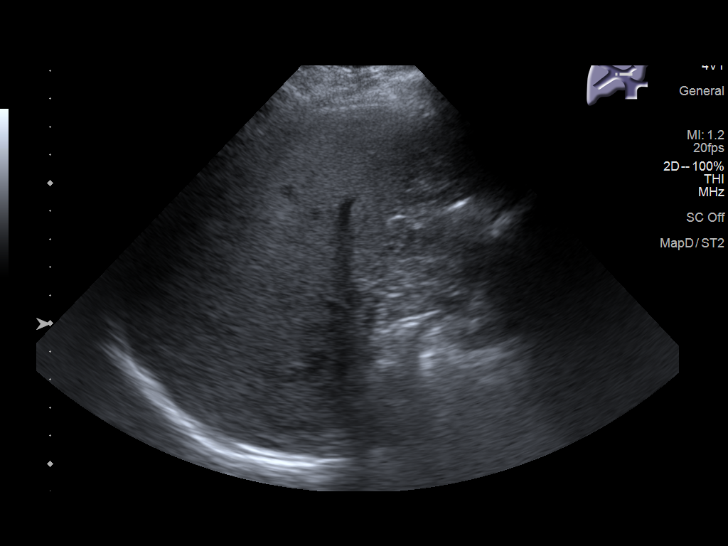
[im 32/39]
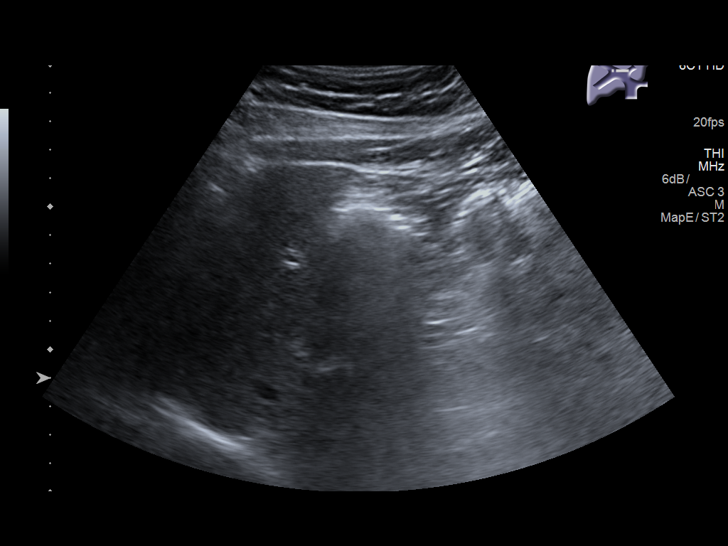
[im 35/39]
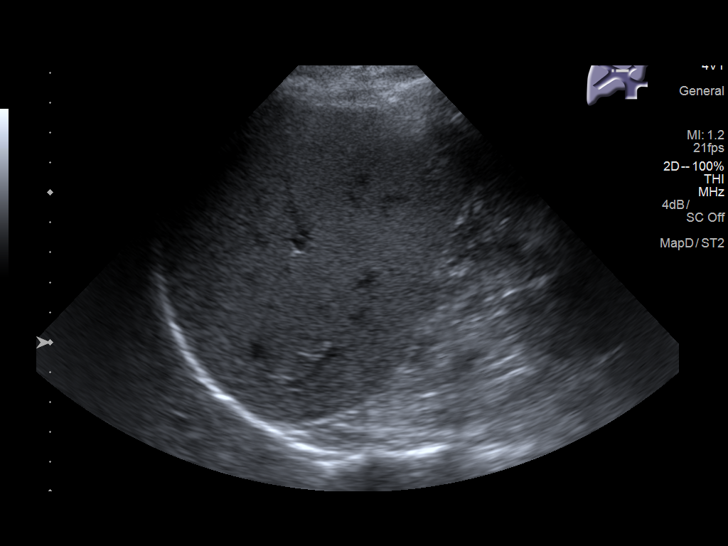
[im 39/39]
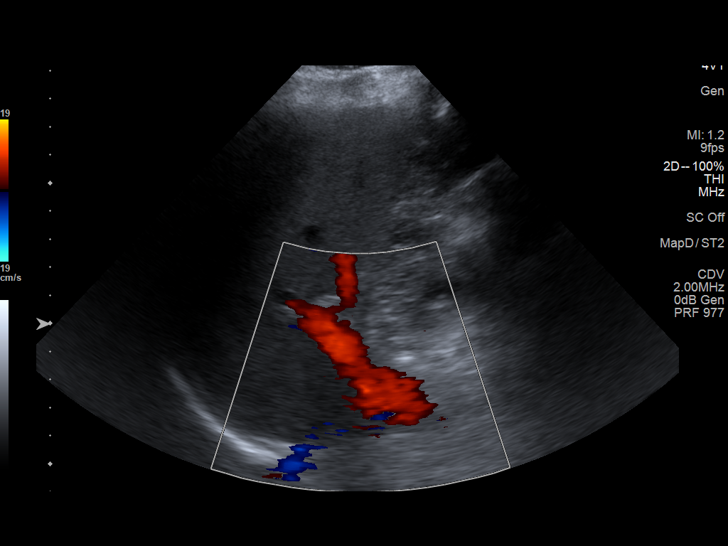

[14 of 25 positions shown; findings below may reference images not displayed]

FINDINGS: Gallbladder:

No gallstones or wall thickening visualized. No sonographic Murphy
sign noted by sonographer.

Common bile duct:

Diameter: 2.4 mm

Liver:

Increased hepatic echogenicity. No focal hepatic abnormality. Portal
vein is patent on color Doppler imaging with normal direction of
blood flow towards the liver.
IMPRESSION: 1. Negative for gallstones or biliary dilatation
2. Increased hepatic echogenicity suggesting steatosis
